# Patient Record
Sex: Female | Born: 1990 | Race: White | Hispanic: Yes | Marital: Single | Smoking: Never smoker
Health system: Southern US, Community
[De-identification: ages and names within clinical notes are randomized; demographics above are authoritative.]

## PROBLEM LIST (undated history)

## (undated) DIAGNOSIS — F988 Other specified behavioral and emotional disorders with onset usually occurring in childhood and adolescence: Secondary | ICD-10-CM

## (undated) DIAGNOSIS — I10 Essential (primary) hypertension: Secondary | ICD-10-CM

## (undated) DIAGNOSIS — K219 Gastro-esophageal reflux disease without esophagitis: Secondary | ICD-10-CM

---

## 2009-02-21 ENCOUNTER — Emergency Department (HOSPITAL_COMMUNITY): Admission: EM | Admit: 2009-02-21 | Discharge: 2009-02-21 | Payer: Self-pay | Admitting: Emergency Medicine

## 2011-02-19 ENCOUNTER — Other Ambulatory Visit: Payer: Self-pay | Admitting: Internal Medicine

## 2011-02-19 ENCOUNTER — Ambulatory Visit
Admission: RE | Admit: 2011-02-19 | Discharge: 2011-02-19 | Disposition: A | Discharge status: Home / Self Care | Payer: Medicare Other | Source: Ambulatory Visit | Attending: Internal Medicine | Admitting: Internal Medicine

## 2011-02-19 DIAGNOSIS — S8392XA Sprain of unspecified site of left knee, initial encounter: Secondary | ICD-10-CM

## 2016-03-07 ENCOUNTER — Ambulatory Visit (HOSPITAL_COMMUNITY)
Admission: EM | Admit: 2016-03-07 | Discharge: 2016-03-07 | Disposition: A | Discharge status: Home / Self Care | Payer: Medicaid Other

## 2016-03-07 ENCOUNTER — Encounter (HOSPITAL_COMMUNITY): Payer: Self-pay | Admitting: Emergency Medicine

## 2016-03-07 DIAGNOSIS — R1011 Right upper quadrant pain: Secondary | ICD-10-CM

## 2016-03-07 HISTORY — DX: Other specified behavioral and emotional disorders with onset usually occurring in childhood and adolescence: F98.8

## 2016-03-07 HISTORY — DX: Gastro-esophageal reflux disease without esophagitis: K21.9

## 2016-03-07 LAB — POCT H PYLORI SCREEN: H. PYLORI SCREEN, POC: NEGATIVE

## 2016-03-07 MED ORDER — OMEPRAZOLE 20 MG PO CPDR: 20.0000 mg | DELAYED_RELEASE_CAPSULE | Freq: Every day | ORAL | 0 refills | Status: DC

## 2016-03-07 NOTE — Discharge Instructions (Signed)
°  Su anlisis de sangre de hoy es negativo para las bacterias en su estmago. Amber RacerLa postura y descripcin de su dolor es preocupante para un problema con su vescula biliar. Esto es algo que usted necesita para Radio producerhacer un seguimiento con su mdico para que pueda obtener un ultrasonido para usted.  Mientras tanto limitar los alimentos picantes. Coma una dieta algo sosa y si su dolor empeora usted debe ir al departamento de Agricultural engineerla emergencia.  Your blood test today is negative for bacteria in your stomach. the postition and description of your pain is concerning for a problem with your gallbladder. this is something that you need to follow up with your doctor so that he can get an ultrasound for you.  meanwhile limit spicy foods. eat a rather bland diet and if your pain worsens you should go to the emergency department.

## 2016-03-07 NOTE — ED Triage Notes (Signed)
Pt has not had a solid BM since before Saturday.  She reports three episodes, of watery, light brown diarrhea on Saturday and no BM since then.  She also has epigastric pain that she describes as burning.  Pt was treated for acid reflux a year ago, but only took the medication for one month.  She denies fever at home.

## 2016-10-15 ENCOUNTER — Ambulatory Visit (HOSPITAL_COMMUNITY)
Admission: EM | Admit: 2016-10-15 | Discharge: 2016-10-15 | Disposition: A | Discharge status: Home / Self Care | Payer: Medicare Other | Attending: Family Medicine | Admitting: Family Medicine

## 2016-10-15 ENCOUNTER — Encounter (HOSPITAL_COMMUNITY): Payer: Self-pay | Admitting: Emergency Medicine

## 2016-10-15 DIAGNOSIS — S99911A Unspecified injury of right ankle, initial encounter: Secondary | ICD-10-CM

## 2016-10-15 DIAGNOSIS — W540XXA Bitten by dog, initial encounter: Secondary | ICD-10-CM

## 2016-10-15 DIAGNOSIS — S99912A Unspecified injury of left ankle, initial encounter: Secondary | ICD-10-CM | POA: Diagnosis not present

## 2016-10-15 NOTE — ED Triage Notes (Signed)
The patient presented to the [facility] with a complaint of a dog bite to both lower legs that occurred yesterday. The patient reported that the animal was current on its rabies vaccinations.

## 2016-10-15 NOTE — ED Provider Notes (Signed)
CSN: [id number]     Arrival date & time 10/15/16  1608 History   None    Chief Complaint  Patient presents with  . Animal Bite   (Consider location/radiation/quality/duration/timing/severity/associated sxs/prior Treatment) 26 year old female presents to clinic for evaluation of dog bites to her lower ankles. States she was bitten yesterday by a chihuahua  on both of her ankles. The dog has been vaccinated against rabies.   The history is provided by the patient.  Animal Bite  Contact animal:  Dog Location:  Foot Foot injury location:  L ankle and R ankle Time since incident:  1 day Pain details:    Quality:  Aching   Severity:  Mild   Timing:  Intermittent   Progression:  Unchanged Incident location:  Another residence Provoked: unprovoked   Notifications:  None Animal's rabies vaccination status:  Up to date Animal in possession: no   Tetanus status:  Unknown Relieved by:  None tried Worsened by:  Nothing Ineffective treatments:  None tried Associated symptoms: no fever, no numbness, no rash and no swelling     Past Medical History:  Diagnosis Date  . Acid reflux   . ADD (attention deficit disorder)    History reviewed. No pertinent surgical history. History reviewed. No pertinent family history. Social History  Substance Use Topics  . Smoking status: Never Smoker  . Smokeless tobacco: Never Used  . Alcohol use No   OB History    No data available     Review of Systems  Constitutional: Negative for chills, fatigue and fever.  Respiratory: Negative for cough, chest tightness, shortness of breath and wheezing.   Cardiovascular: Negative for chest pain and palpitations.  Gastrointestinal: Negative for abdominal pain, diarrhea, nausea and vomiting.  Musculoskeletal: Negative for myalgias, neck pain and neck stiffness.  Skin: Negative for rash and wound.  Neurological: Negative for dizziness and numbness.    Allergies  Patient has no known allergies.  Home  Medications   Prior to Admission medications   Not on File   Meds Ordered and Administered this Visit  Medications - No data to display  BP 137/79 (BP Location: Right Arm)   Pulse 71   Temp 98.6 F (37 C) (Oral)   Resp 18   SpO2 100%  No data found.   Physical Exam  Constitutional: She is oriented to person, place, and time. She appears well-developed and well-nourished. No distress.  HENT:  Head: Normocephalic and atraumatic.  Right Ear: External ear normal.  Left Ear: External ear normal.  Cardiovascular: Normal rate and regular rhythm.   Pulmonary/Chest: Effort normal and breath sounds normal.  Abdominal: Soft. Bowel sounds are normal.  Neurological: She is alert and oriented to person, place, and time.  Skin: Skin is warm and dry. Capillary refill takes less than 2 seconds. No rash noted. She is not diaphoretic. No erythema.  2 small bite marks one on each ankle, with bruising to the left ankle. Wounds appear to be superficial, no active bleeding, PMS intact distal to the wounds   Psychiatric: She has a normal mood and affect. Her behavior is normal.  Nursing note and vitals reviewed.   Urgent Care Course     Procedures (including critical care time)  Labs Review Labs Reviewed - No data to display  Imaging Review No results found.      MDM   1. Dog bite, initial encounter     Tetanus vaccine was offered but was declined, wounds were cleaned with Betadine,  patient encouraged to follow up with primary care, return to clinic if she develops any signs or symptoms of infection.    Dorena Bodo, NP 10/15/16 2034

## 2016-10-15 NOTE — Discharge Instructions (Signed)
Keep your wounds clean, and dry. If any time he seems redness, swelling, red streaking traveling up her legs, or draining pus from the bites, return to clinic for antibiotics. We have cleaned your wound here in clinic today. She did any time if symptoms worsen return to clinic or follow-up with your primary care provider

## 2017-11-18 ENCOUNTER — Ambulatory Visit: Payer: Medicaid Other | Admitting: Podiatry

## 2017-12-05 ENCOUNTER — Ambulatory Visit (INDEPENDENT_AMBULATORY_CARE_PROVIDER_SITE_OTHER): Payer: Medicare Other

## 2017-12-05 ENCOUNTER — Ambulatory Visit (INDEPENDENT_AMBULATORY_CARE_PROVIDER_SITE_OTHER): Payer: Medicare Other | Admitting: Podiatry

## 2017-12-05 ENCOUNTER — Encounter: Payer: Self-pay | Admitting: Podiatry

## 2017-12-05 DIAGNOSIS — M21612 Bunion of left foot: Secondary | ICD-10-CM

## 2017-12-05 DIAGNOSIS — M21619 Bunion of unspecified foot: Secondary | ICD-10-CM

## 2017-12-05 DIAGNOSIS — M21611 Bunion of right foot: Secondary | ICD-10-CM | POA: Diagnosis not present

## 2017-12-05 NOTE — Progress Notes (Signed)
Subjective:   Patient ID: Amber Salinas, female   DOB: 27 y.o.   MRN: [id number]   HPI 27 year old female presents the office today with concerns of bunions to both of her feet with the left side worse than the right.  She says the area is painful with pressure in shoes.  Denies any recent injury or trauma.  She said no recent treatment for this.  Denies any numbness or tingling.  She has no other concerns.   Review of Systems  All other systems reviewed and are negative.  Past Medical History:  Diagnosis Date  . Acid reflux   . ADD (attention deficit disorder)     History reviewed. No pertinent surgical history.  No current outpatient medications on file.  No Known Allergies        Objective:  Physical Exam  General: AAO x3, NAD  Dermatological: Skin is warm, dry and supple bilateral. Nails x 10 are well manicured; remaining integument appears unremarkable at this time. There are no open sores, no preulcerative lesions, no rash or signs of infection present.  Vascular: Dorsalis Pedis artery and Posterior Tibial artery pedal pulses are 2/4 bilateral with immedate capillary fill time.There is no pain with calf compression, swelling, warmth, erythema.   Neruologic: Grossly intact via light touch bilateral.  Protective threshold with Semmes Wienstein monofilament intact to all pedal sites bilateral.   Musculoskeletal: Clinically doing moderately to be in the left foot there is minimal HAV on the right foot.  There is tenderness palpation of the bunion sites.  There is no crepitation with first MPJ range of motion however there is hypermobility present of the first ray.  There is no other areas of tenderness identified at this time.  Decrease in medial arch upon weightbearing.  Muscular strength 5/5 in all groups tested bilateral.  Gait: Unassisted, Nonantalgic.       Assessment:   Bilateral HAV    Plan:  -Treatment options discussed including all alternatives,  risks, and complications -Etiology of symptoms were discussed -X-rays were obtained and reviewed with the patient.  Severe HAV is present.  There is no evidence of acute fracture or stress fracture. -We discussed both conservative as well as surgical treatment options.  This time she elects to proceed with continue with conservative trach care.  We discussed shoe uppercase, orthotics, padding.  Also discussed steroid injection if needed.  In the future if she desires surgical intervention we discussed a Lapidus bunionectomy.  We discussed the surgery as well as the postoperative course. -Follow-up as needed.  Vivi Barrack DPM

## 2017-12-05 NOTE — Patient Instructions (Signed)

## 2017-12-05 NOTE — Progress Notes (Signed)
Dg foot  

## 2019-08-09 ENCOUNTER — Other Ambulatory Visit: Payer: Self-pay

## 2019-08-09 ENCOUNTER — Encounter (HOSPITAL_COMMUNITY): Payer: Self-pay | Admitting: Emergency Medicine

## 2019-08-09 ENCOUNTER — Emergency Department (HOSPITAL_COMMUNITY)
Admission: EM | Admit: 2019-08-09 | Discharge: 2019-08-09 | Disposition: A | Discharge status: Home / Self Care | Payer: Medicare Other | Attending: Emergency Medicine | Admitting: Emergency Medicine

## 2019-08-09 DIAGNOSIS — R030 Elevated blood-pressure reading, without diagnosis of hypertension: Secondary | ICD-10-CM | POA: Insufficient documentation

## 2019-08-09 DIAGNOSIS — U071 COVID-19: Secondary | ICD-10-CM | POA: Diagnosis not present

## 2019-08-09 MED ORDER — BENZONATATE 100 MG PO CAPS: 100.0000 mg | ORAL_CAPSULE | Freq: Three times a day (TID) | ORAL | 0 refills | Status: DC

## 2019-08-09 NOTE — Discharge Instructions (Signed)
As discussed, your blood pressure was mildly elevated today in the ER. I recommend you call your PCP tomorrow to schedule an appointment for further evaluation. I am also sending you home with cough medication for your cough. Continue to self quarantine away from others for 10 days since your symptoms onset. You must be fever free for 3 days before you quarantine can end. Return to the ER for new or worsening symptoms.

## 2019-08-09 NOTE — ED Triage Notes (Signed)
C/o BP 144/90 at home.  Denies any complaints at this time.  Pt tested COVID + yesterday and all family members at home are +.

## 2019-08-09 NOTE — ED Provider Notes (Signed)
[facility] EMERGENCY DEPARTMENT Provider Note   CSN: [id number] Arrival date & time: 08/09/19  7510     History Chief Complaint  Patient presents with   COVID +   Hypertension    Amber Salinas is a 29 y.o. female with a past medical history significant for GERD and ADHD who presents the ED due to concerns of elevated blood pressure at home.  Patient states she took her blood pressure at home which was 144/90.  Patient denies history of hypertension.  Patient denies chest pain, headache, changes to vision, dysuria, and decreased urinary output. Patient also states she tested positive for Covid yesterday.  She went to urgent care yesterday given her family members previously tested positive.  She admits to an intermittent dry cough but denies fever, chills, abdominal pain, nausea, vomiting, sore throat, and shortness of breath. Patient has not tried anything for her symptoms prior to arrival. Patient denies numbness/tingling and unilateral weakness.       Past Medical History:  Diagnosis Date   Acid reflux    ADD (attention deficit disorder)     Patient Active Problem List   Diagnosis Date Noted   Bunion 12/05/2017    History reviewed. No pertinent surgical history.   OB History   No obstetric history on file.     No family history on file.  Social History   Tobacco Use   Smoking status: Never Smoker   Smokeless tobacco: Never Used  Substance Use Topics   Alcohol use: No   Drug use: No    Home Medications Prior to Admission medications   Medication Sig Start Date End Date Taking? Authorizing Provider  benzonatate (TESSALON) 100 MG capsule Take 1 capsule (100 mg total) by mouth every 8 (eight) hours. 08/09/19   Mannie Stabile, PA-C    Allergies    Patient has no known allergies.  Review of Systems   Review of Systems  Constitutional: Negative for chills and fever.  HENT: Negative for congestion, rhinorrhea and sore  throat.   Eyes: Negative for visual disturbance.  Respiratory: Positive for cough (intermittent dry cough). Negative for shortness of breath.   Cardiovascular: Negative for chest pain, palpitations and leg swelling.  Gastrointestinal: Negative for abdominal pain, diarrhea, nausea and vomiting.  Genitourinary: Positive for frequency (diagnosed with overactive bladder by PCP). Negative for dysuria.  Neurological: Negative for weakness, numbness and headaches.    Physical Exam Updated Vital Signs BP (!) 141/92 (BP Location: Right Arm)    Pulse 94    Temp 98.5 F (36.9 C) (Oral)    Resp 16    Ht 5\' 4"  (1.626 m)    LMP 07/26/2019    SpO2 100%   Physical Exam Vitals and nursing note reviewed.  Constitutional:      General: She is not in acute distress.    Appearance: She is not ill-appearing.  HENT:     Head: Normocephalic.  Eyes:     Extraocular Movements: Extraocular movements intact.     Pupils: Pupils are equal, round, and reactive to light.  Cardiovascular:     Rate and Rhythm: Normal rate and regular rhythm.     Pulses: Normal pulses.     Heart sounds: Normal heart sounds. No murmur. No friction rub. No gallop.   Pulmonary:     Effort: Pulmonary effort is normal.     Breath sounds: Normal breath sounds.     Comments: Respirations equal and unlabored, patient able to speak in  full sentences, lungs clear to auscultation bilaterally. Abdominal:     General: Abdomen is flat. There is no distension.     Palpations: Abdomen is soft.     Tenderness: There is no abdominal tenderness. There is no guarding or rebound.  Musculoskeletal:     Cervical back: Neck supple.     Right lower leg: No edema.     Left lower leg: No edema.     Comments: Able to move all 4 extremities without difficulty. No lower extremity edema.   Skin:    General: Skin is warm and dry.  Neurological:     General: No focal deficit present.     Mental Status: She is alert.     Comments: Speech is clear, able to  follow commands CN III-XII intact Normal strength in upper and lower extremities bilaterally including dorsiflexion and plantar flexion, strong and equal grip strength Sensation grossly intact throughout Moves extremities without ataxia, coordination intact No pronator drift Ambulates without difficulty     ED Results / Procedures / Treatments   Labs (all labs ordered are listed, but only abnormal results are displayed) Labs Reviewed - No data to display  EKG None  Radiology No results found.  Procedures Procedures (including critical care time)  Medications Ordered in ED Medications - No data to display  ED Course  I have reviewed the triage vital signs and the nursing notes.  Pertinent labs & imaging results that were available during my care of the patient were reviewed by me and considered in my medical decision making (see chart for details).    MDM Rules/Calculators/A&P                     28 year old female presents to the ED due to concerns of elevated blood pressure.  She took her blood pressure at home which was 144/90.  Patient denies history of hypertension.  She also tested positive for Covid yesterday at urgent care.  Patient admits to an intermittent dry cough, but denies shortness of breath, fever, chills, lower extremity edema, abdominal pain, nausea, vomiting, diarrhea.  Blood pressure in the ED upon arrival was 155/99.  Upon recheck blood pressure decreased to 141/92.  Patient without signs of hypertensive urgency. No concern for hypertensive emergency. Do not feel any labs or EKG are warranted at this time.  Discussed case with Dr. Particia Nearing who agrees with assessment and plan. Advised patient to call PCP tomorrow to further evaluate high blood pressure. Will discharge patient with cough medication. Patient ambulated in the ED and maintained O2 saturation between 98-100% without difficulty. Lungs clear to auscultation bilaterally. No chest x-ray warranted at this  time. Self quarantine guidelines discussed with patient. Strict ED precautions discussed with patient. Patient states understanding and agrees to plan. Patient discharged home in no acute distress and stable vitals.  Amber Salinas was evaluated in Emergency Department on 08/09/2019 for the symptoms described in the history of present illness. She was evaluated in the context of the global COVID-19 pandemic, which necessitated consideration that the patient might be at risk for infection with the SARS-CoV-2 virus that causes COVID-19. Institutional protocols and algorithms that pertain to the evaluation of patients at risk for COVID-19 are in a state of rapid change based on information released by regulatory bodies including the CDC and federal and state organizations. These policies and algorithms were followed during the patient's care in the ED.  Final Clinical Impression(s) / ED Diagnoses Final diagnoses:  Elevated  blood pressure reading  COVID-19 virus infection    Rx / DC Orders ED Discharge Orders         Ordered    benzonatate (TESSALON) 100 MG capsule  Every 8 hours     08/09/19 1044           Karie Kirks 08/09/19 1048    Isla Pence, MD 08/09/19 1059

## 2019-08-09 NOTE — ED Notes (Addendum)
Pt ambulated in exam room on pulse ox. O2 saturation stayed between 98-100%. This tech asked pt if she was experiencing any ShOB. Pt stated, "only a little bit." Pt did not appear to be in any distress upon ambulating. PA, [place], notified.

## 2019-08-23 ENCOUNTER — Emergency Department (HOSPITAL_COMMUNITY)
Admission: EM | Admit: 2019-08-23 | Discharge: 2019-08-23 | Disposition: A | Discharge status: Home / Self Care | Payer: Medicare Other | Attending: Emergency Medicine | Admitting: Emergency Medicine

## 2019-08-23 ENCOUNTER — Other Ambulatory Visit: Payer: Self-pay

## 2019-08-23 ENCOUNTER — Emergency Department (HOSPITAL_COMMUNITY): Payer: Medicare Other

## 2019-08-23 DIAGNOSIS — Z79899 Other long term (current) drug therapy: Secondary | ICD-10-CM | POA: Insufficient documentation

## 2019-08-23 DIAGNOSIS — R1031 Right lower quadrant pain: Secondary | ICD-10-CM | POA: Insufficient documentation

## 2019-08-23 LAB — I-STAT BETA HCG BLOOD, ED (MC, WL, AP ONLY): I-stat hCG, quantitative: <5 m[IU]/mL (ref <5)

## 2019-08-23 LAB — WET PREP, GENITAL
Clue Cells Wet Prep HPF POC: NONE SEEN
Sperm: NONE SEEN
Trich, Wet Prep: NONE SEEN
Yeast Wet Prep HPF POC: NONE SEEN

## 2019-08-23 LAB — CBC WITH DIFFERENTIAL/PLATELET
Abs Immature Granulocytes: 0.01 10*3/uL (ref 0.00–0.07)
Basophils Absolute: 0 10*3/uL (ref 0.0–0.1)
Basophils Relative: 1 %
Eosinophils Absolute: 0.1 10*3/uL (ref 0.0–0.5)
Eosinophils Relative: 1 %
HCT: 39.2 % (ref 36.0–46.0)
Hemoglobin: 12.8 g/dL (ref 12.0–15.0)
Immature Granulocytes: 0 %
Lymphocytes Relative: 18 %
Lymphs Abs: 1.5 10*3/uL (ref 0.7–4.0)
MCH: 28.1 pg (ref 26.0–34.0)
MCHC: 32.7 g/dL (ref 30.0–36.0)
MCV: 86 fL (ref 80.0–100.0)
Monocytes Absolute: 0.4 10*3/uL (ref 0.1–1.0)
Monocytes Relative: 5 %
Neutro Abs: 6 10*3/uL (ref 1.7–7.7)
Neutrophils Relative %: 75 %
Platelets: 230 10*3/uL (ref 150–400)
RBC: 4.56 MIL/uL (ref 3.87–5.11)
RDW: 13.2 % (ref 11.5–15.5)
WBC: 8 10*3/uL (ref 4.0–10.5)
nRBC: 0 % (ref 0.0–0.2)

## 2019-08-23 LAB — LIPASE, BLOOD: Lipase: 31 U/L (ref 11–51)

## 2019-08-23 LAB — COMPREHENSIVE METABOLIC PANEL
ALT: 40 U/L (ref 0–44)
AST: 26 U/L (ref 15–41)
Albumin: 4.4 g/dL (ref 3.5–5.0)
Alkaline Phosphatase: 47 U/L (ref 38–126)
Anion gap: 11 (ref 5–15)
BUN: 12 mg/dL (ref 6–20)
CO2: 23 mmol/L (ref 22–32)
Calcium: 9.6 mg/dL (ref 8.9–10.3)
Chloride: 104 mmol/L (ref 98–111)
Creatinine, Ser: 0.64 mg/dL (ref 0.44–1.00)
GFR calc Af Amer: >60 mL/min (ref >60)
GFR calc non Af Amer: >60 mL/min (ref >60)
Glucose, Bld: 116 mg/dL — ABNORMAL HIGH (ref 70–99)
Potassium: 3.2 mmol/L — ABNORMAL LOW (ref 3.5–5.1)
Sodium: 138 mmol/L (ref 135–145)
Total Bilirubin: 0.3 mg/dL (ref 0.3–1.2)
Total Protein: 7.5 g/dL (ref 6.5–8.1)

## 2019-08-23 LAB — URINALYSIS, ROUTINE W REFLEX MICROSCOPIC
Bilirubin Urine: NEGATIVE
Glucose, UA: NEGATIVE mg/dL
Ketones, ur: NEGATIVE mg/dL
Nitrite: NEGATIVE
Protein, ur: NEGATIVE mg/dL
Specific Gravity, Urine: 1.016 (ref 1.005–1.030)
pH: 6 (ref 5.0–8.0)

## 2019-08-23 MED ORDER — ONDANSETRON HCL 4 MG/2ML IJ SOLN: 4.0000 mg | INTRAMUSCULAR | Status: DC | PRN

## 2019-08-23 MED ORDER — HYDROMORPHONE HCL 1 MG/ML IJ SOLN
0.5000 mg | Freq: Once | INTRAMUSCULAR | Status: AC
  Administered 2019-08-23: 0.5 mg via INTRAVENOUS
  Filled 2019-08-23: qty 1

## 2019-08-23 MED ORDER — SODIUM CHLORIDE 0.9% FLUSH: 3.0000 mL | Freq: Once | INTRAVENOUS | Status: DC

## 2019-08-23 NOTE — Discharge Instructions (Addendum)
Your tests here, including your blood tests, x-rays and pelvic exam do not identify a source of your pain. The test do insure that your appendix is normal, and there is not likely any infection present.   Please follow up with Dr. Mikeal Hawthorne for further evaluation in the outpatient setting to continue your evaluation. Take Tylenol for pain.  If you develop severe pain, have any uncontrolled vomiting or a high fever, please return to the emergency department.

## 2019-08-23 NOTE — ED Triage Notes (Signed)
Per pt she started having severe right flank and abdominal pain tonight. Pt has severe cramping and burning with urination. Pt said she was dx with covid on January 22

## 2019-08-23 NOTE — ED Provider Notes (Signed)
[facility] EMERGENCY DEPARTMENT Provider Note   CSN: [id number] Arrival date & time: 08/23/19  0242     History Chief Complaint  Patient presents with  . Flank Pain  . Abdominal Pain    Amber Salinas is a 29 y.o. female.  Patient to ED with sudden onset of RLQ abdominal pain that started tonight. No nausea, vomiting, diarrhea. No fever. She reports that she went to the bathroom to urinate, and then felt she needed to go almost immediately after, but reports she could not due to the abdominal pain. No history of kidney stones. No pain in the back or right flank. She reports that she takes oxybutynin for "my urine" but does not know her diagnosis that makes this necessary. No vaginal discharge.    The history is provided by the patient.  Flank Pain Associated symptoms include abdominal pain. Pertinent negatives include no chest pain and no shortness of breath.  Abdominal Pain Associated symptoms: dysuria   Associated symptoms: no chest pain, no cough, no diarrhea, no fever, no hematuria, no nausea, no shortness of breath, no vaginal bleeding, no vaginal discharge and no vomiting        Past Medical History:  Diagnosis Date  . Acid reflux   . ADD (attention deficit disorder)     Patient Active Problem List   Diagnosis Date Noted  . Bunion 12/05/2017    No past surgical history on file.   OB History   No obstetric history on file.     No family history on file.  Social History   Tobacco Use  . Smoking status: Never Smoker  . Smokeless tobacco: Never Used  Substance Use Topics  . Alcohol use: No  . Drug use: No    Home Medications Prior to Admission medications   Medication Sig Start Date End Date Taking? Authorizing Provider  benzonatate (TESSALON) 100 MG capsule Take 1 capsule (100 mg total) by mouth every 8 (eight) hours. 08/09/19   Suzy Bouchard, PA-C    Allergies    Patient has no known allergies.  Review of Systems    Review of Systems  Constitutional: Negative for fever.  HENT: Negative.   Respiratory: Negative.  Negative for cough and shortness of breath.   Cardiovascular: Negative for chest pain.  Gastrointestinal: Positive for abdominal pain. Negative for diarrhea, nausea and vomiting.  Genitourinary: Positive for difficulty urinating, dysuria and flank pain. Negative for hematuria, vaginal bleeding and vaginal discharge.  Musculoskeletal: Negative for back pain.  Skin: Negative for rash.    Physical Exam Updated Vital Signs BP (!) 162/110 (BP Location: Right Arm)   Pulse (!) 106   Temp 98.6 F (37 C) (Oral)   Resp 18   LMP 07/26/2019   SpO2 99%   Physical Exam Vitals and nursing note reviewed.  Constitutional:      Appearance: She is well-developed. She is obese.  HENT:     Head: Normocephalic.  Cardiovascular:     Rate and Rhythm: Normal rate and regular rhythm.  Pulmonary:     Effort: Pulmonary effort is normal.     Breath sounds: Normal breath sounds.  Abdominal:     General: Bowel sounds are normal.     Palpations: Abdomen is soft.     Tenderness: There is abdominal tenderness in the suprapubic area. There is guarding. There is no rebound.  Musculoskeletal:        General: Normal range of motion.     Cervical back:  Normal range of motion and neck supple.  Skin:    General: Skin is warm and dry.     Findings: No rash.  Neurological:     Mental Status: She is alert.     Cranial Nerves: No cranial nerve deficit.     ED Results / Procedures / Treatments   Labs (all labs ordered are listed, but only abnormal results are displayed) Labs Reviewed  COMPREHENSIVE METABOLIC PANEL - Abnormal; Notable for the following components:      Result Value   Potassium 3.2 (*)    Glucose, Bld 116 (*)    All other components within normal limits  URINALYSIS, ROUTINE W REFLEX MICROSCOPIC - Abnormal; Notable for the following components:   Color, Urine STRAW (*)    Hgb urine dipstick  MODERATE (*)    Leukocytes,Ua TRACE (*)    Bacteria, UA RARE (*)    All other components within normal limits  LIPASE, BLOOD  CBC WITH DIFFERENTIAL/PLATELET  I-STAT BETA HCG BLOOD, ED ([facility], [facility], [facility] ONLY)    EKG None  Radiology No results found.  Procedures Procedures (including critical care time)  Medications Ordered in ED Medications  sodium chloride flush (NS) 0.9 % injection 3 mL (has no administration in time range)  HYDROmorphone (DILAUDID) injection 0.5 mg (has no administration in time range)  ondansetron (ZOFRAN) injection 4 mg (has no administration in time range)    ED Course  I have reviewed the triage vital signs and the nursing notes.  Pertinent labs & imaging results that were available during my care of the patient were reviewed by me and considered in my medical decision making (see chart for details).    MDM Rules/Calculators/A&P                      Patient to ED with sudden onset RLQ abdominal pain as detailed in HPI.   No fever, vomiting, diarrhea. CT scan ordered and is negative for appy. Labs are essentially unremarkable. Pelvic exam limited as she has never been sexually active nor had a pelvic exam. Wet prep done and show WBC's only.   Discussed test results with the patient. VSS. Pain is improved. She is felt appropriate for discharge home and is strongly encouraged to follow up with Dr. Mikeal Hawthorne.  Final Clinical Impression(s) / ED Diagnoses Final diagnoses:  None   1. RLQ abdominal pain  Rx / DC Orders ED Discharge Orders    None       Danne Harbor 08/24/19 0902    Mesner, Barbara Cower, MD 08/25/19 [phone]

## 2020-11-02 IMAGING — CT CT RENAL STONE PROTOCOL
2 of 4 series · 16 of 46 positions shown, 18 images · non-contrast
Comparison: None.

CLINICAL DATA: Right lower quadrant abdominal pain

EXAM:
CT ABDOMEN AND PELVIS WITHOUT CONTRAST
TECHNIQUE: Multidetector CT imaging of the abdomen and pelvis was performed
following the standard protocol without IV contrast.

[Series 3: renal stone 5.0 · axial · 0.75mm/px · z∈[-432,-32]mm · 13 of 90 slices shown, 15 images]
[im 5/90  soft-tissue]
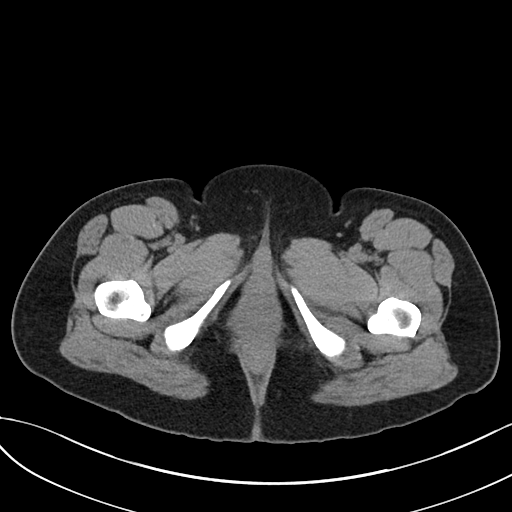
[im 5/90  bone]
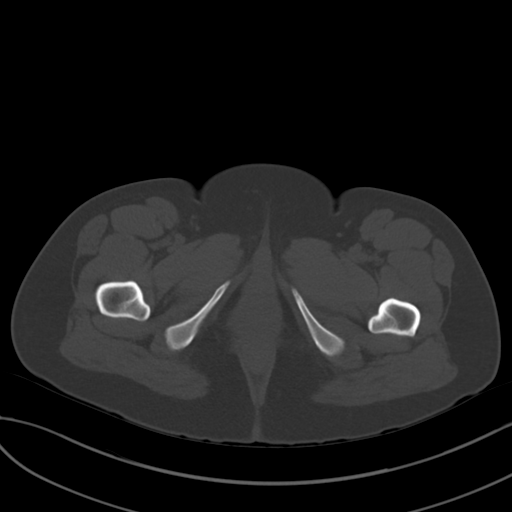
[im 13/90  soft-tissue]
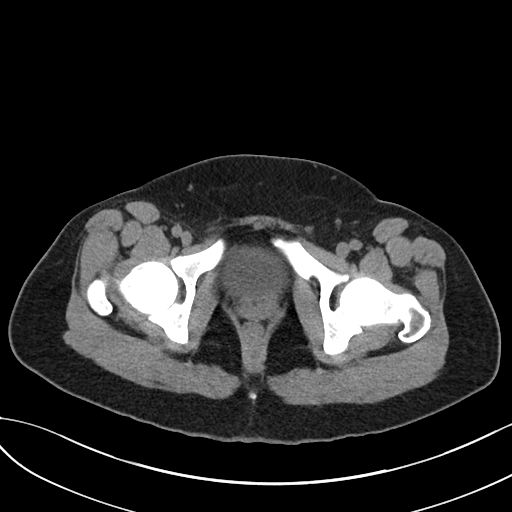
[im 21/90  soft-tissue]
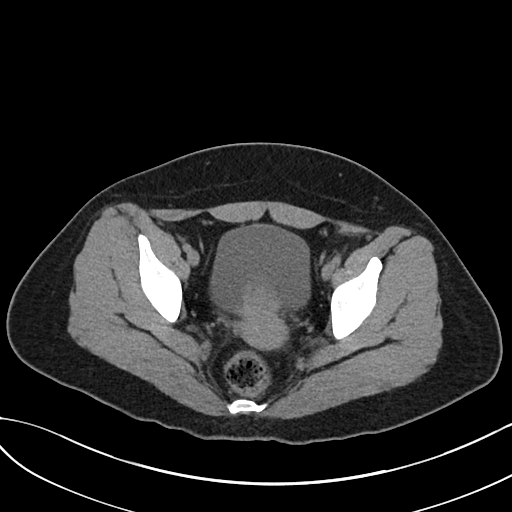
[im 25/90  soft-tissue]
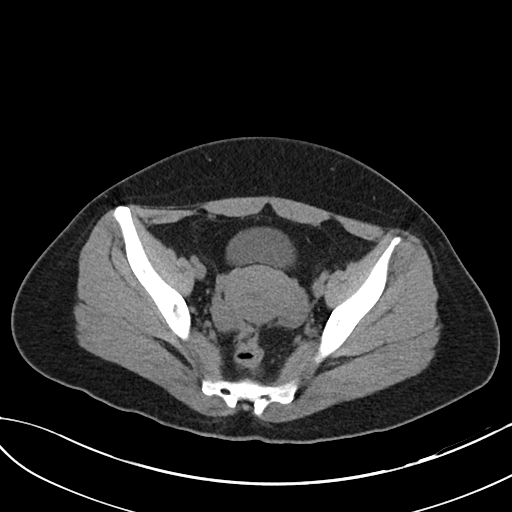
[im 33/90  soft-tissue]
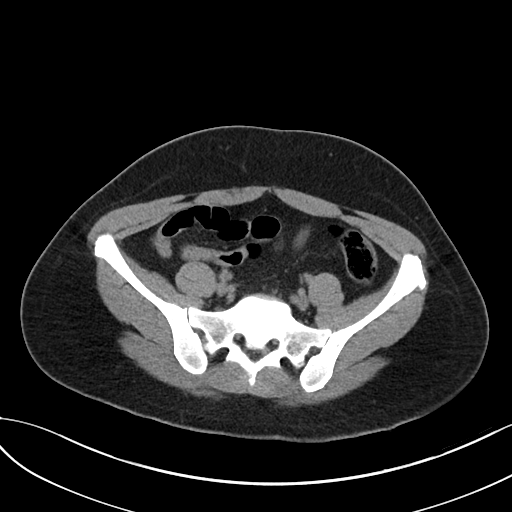
[im 37/90  soft-tissue]
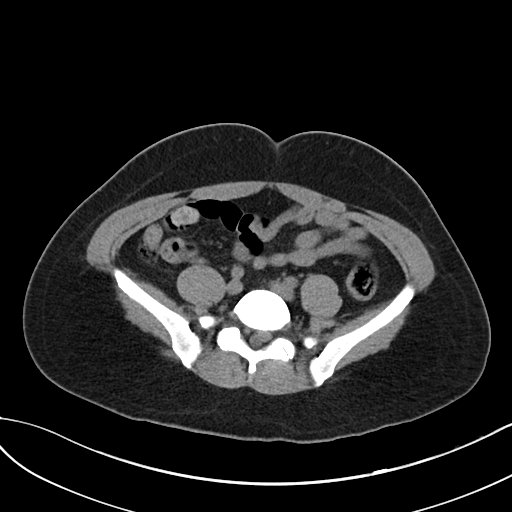
[im 45/90  soft-tissue]
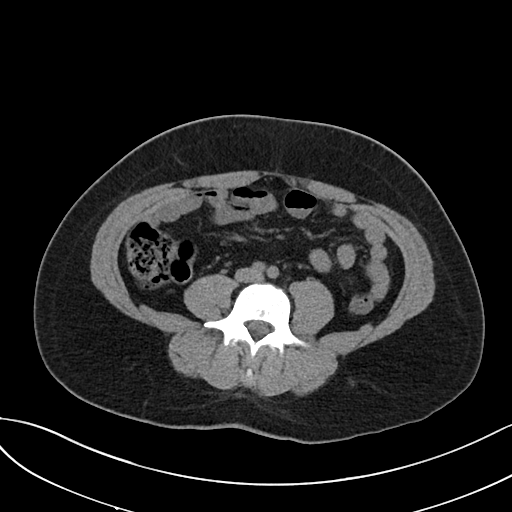
[im 53/90  soft-tissue]
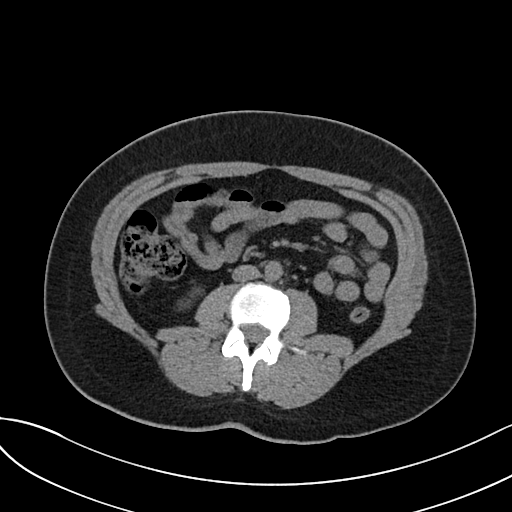
[im 57/90  soft-tissue]
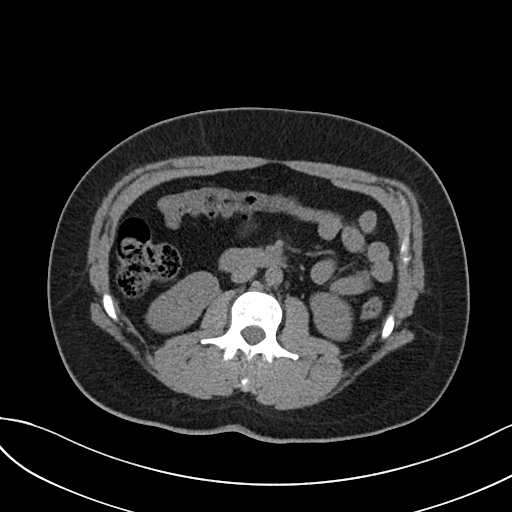
[im 57/90  bone]
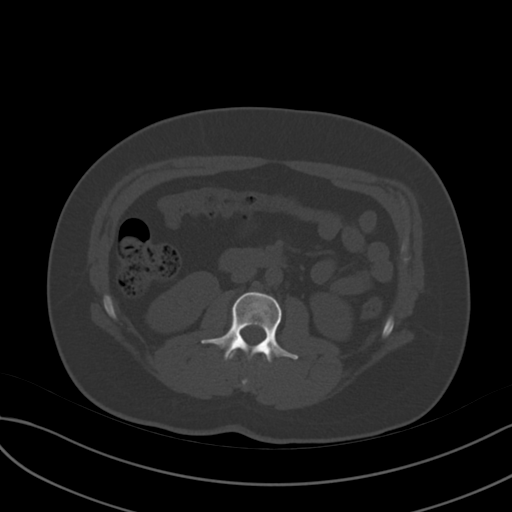
[im 65/90  soft-tissue]
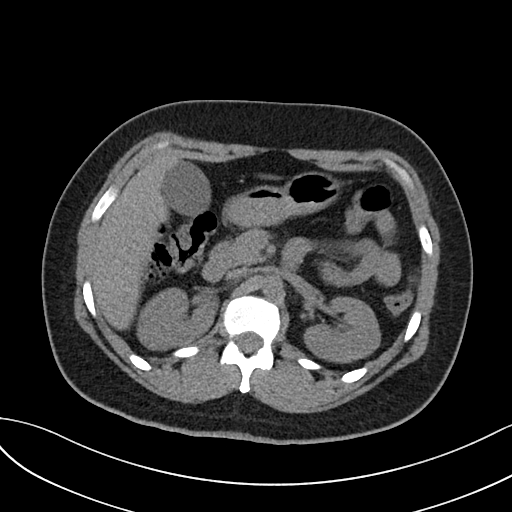
[im 69/90  soft-tissue]
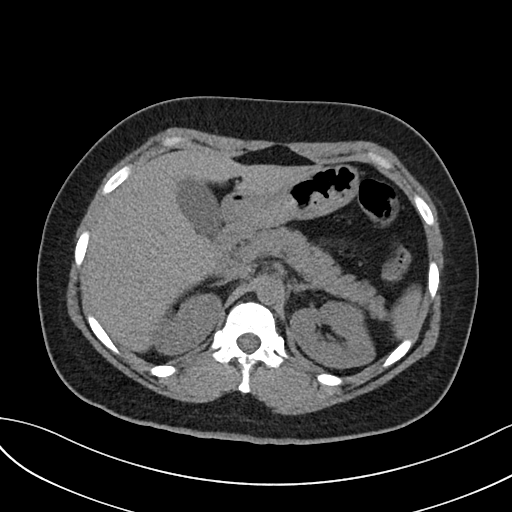
[im 77/90  soft-tissue]
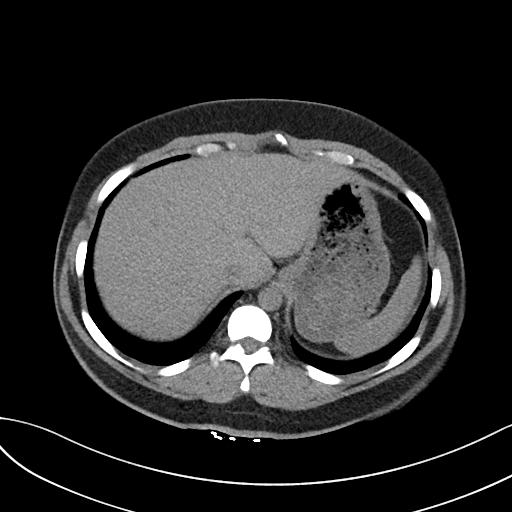
[im 85/90  soft-tissue]
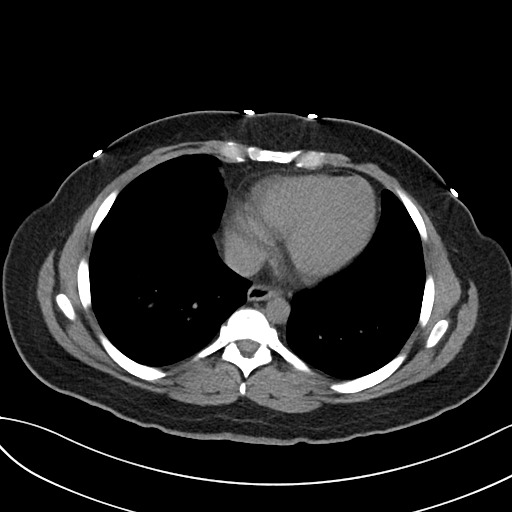

[Series 5: renal stone 3.0 cor · coronal · 0.62mm/px · 3 of 89 slices shown]
[im 30/89  soft-tissue]
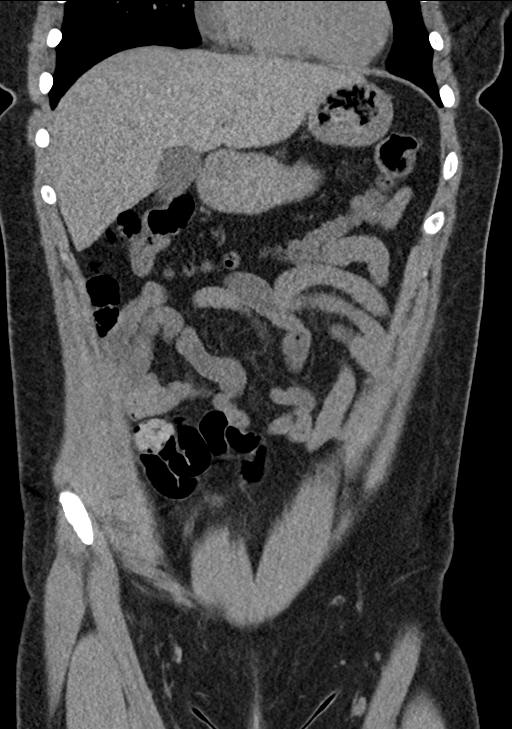
[im 40/89  soft-tissue]
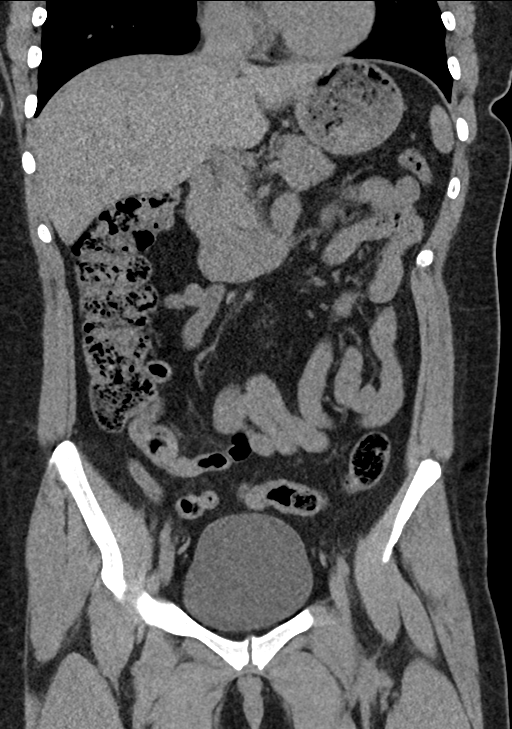
[im 49/89  soft-tissue]
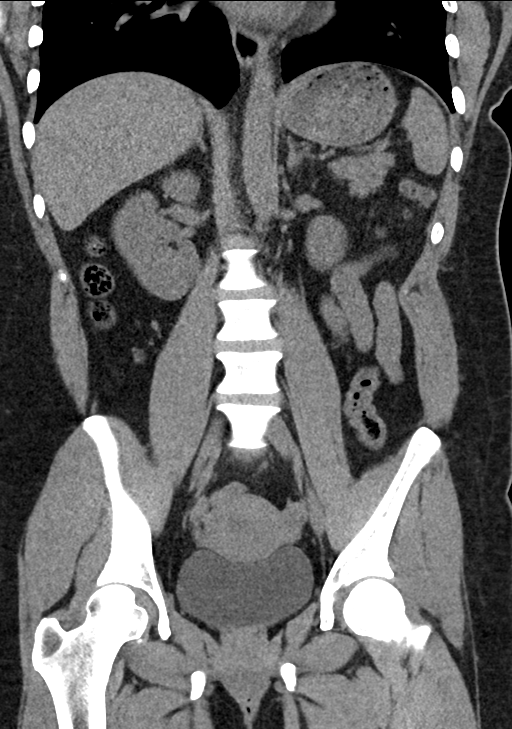

[16 of 46 positions shown; findings below may reference images not displayed]

FINDINGS: Lower chest: Normal

Hepatobiliary: Normal

Pancreas: Normal

Spleen: Normal

Adrenals/Urinary Tract: Adrenal glands are normal. Kidneys are
normal. No swelling. No hydronephrosis. No mass, cyst or stone. No
stone in the bladder.

Stomach/Bowel: No acute bowel finding.  The appendix looks normal.

Vascular/Lymphatic: Negative

Reproductive: Normal. No evidence of pelvic mass or inflammatory
disease.

Other: No free fluid or air.

Musculoskeletal: Normal
IMPRESSION: Normal examination. No abnormality seen to explain the presenting
symptoms. No evidence of urinary tract stone disease, other urinary
tract pathology, appendicitis or GYN pathology.

## 2022-01-20 ENCOUNTER — Encounter: Payer: Self-pay | Admitting: Critical Care Medicine

## 2022-01-20 ENCOUNTER — Encounter: Payer: Self-pay | Admitting: *Deleted

## 2022-01-20 LAB — GLUCOSE, POCT (MANUAL RESULT ENTRY): POC Glucose: 97 mg/dl (ref 70–99)

## 2022-01-20 NOTE — Progress Notes (Signed)
See other screening note

## 2022-10-03 ENCOUNTER — Ambulatory Visit: Payer: Medicare Other | Admitting: Podiatry

## 2022-10-04 ENCOUNTER — Ambulatory Visit: Payer: Medicare Other | Admitting: Podiatry

## 2022-10-15 ENCOUNTER — Ambulatory Visit (INDEPENDENT_AMBULATORY_CARE_PROVIDER_SITE_OTHER): Payer: Medicare Other | Admitting: Podiatry

## 2022-10-15 ENCOUNTER — Encounter: Payer: Self-pay | Admitting: Podiatry

## 2022-10-15 ENCOUNTER — Ambulatory Visit (INDEPENDENT_AMBULATORY_CARE_PROVIDER_SITE_OTHER): Payer: Medicare Other

## 2022-10-15 DIAGNOSIS — M21619 Bunion of unspecified foot: Secondary | ICD-10-CM

## 2022-10-15 DIAGNOSIS — M778 Other enthesopathies, not elsewhere classified: Secondary | ICD-10-CM | POA: Diagnosis not present

## 2022-10-15 DIAGNOSIS — M7672 Peroneal tendinitis, left leg: Secondary | ICD-10-CM | POA: Diagnosis not present

## 2022-10-15 MED ORDER — TRIAMCINOLONE ACETONIDE 10 MG/ML IJ SUSP
10.0000 mg | Freq: Once | INTRAMUSCULAR | Status: AC
Start: 2022-10-15 — End: 2022-10-15
  Administered 2022-10-15: 10 mg

## 2022-10-17 NOTE — Progress Notes (Signed)
Subjective:   Patient ID: Amber Salinas, female   DOB: 32 y.o.   MRN: [id number]   HPI Patient states that she has developed a lot of pain in the outside of her left foot in the last week or 2 and that she has severe bunion deformities of both feet that she is known she is needed to get corrected and has more time now to be able to do this.  Patient did see Dr. Earleen Newport approximately 5 years ago.  Patient does not smoke tries to be active   Review of Systems  All other systems reviewed and are negative.       Objective:  Physical Exam Vitals and nursing note reviewed.  Constitutional:      Appearance: She is well-developed.  Pulmonary:     Effort: Pulmonary effort is normal.  Musculoskeletal:        General: Normal range of motion.  Skin:    General: Skin is warm.  Neurological:     Mental Status: She is alert.     Neurovascular status intact muscle strength adequate range of motion adequate with patient noted to have exquisite discomfort in the lateral side of the peroneal insertion fifth metatarsal with fluid buildup no indication of tendon dysfunction and is found to have significant flatfoot deformity with large bunion deformity bilateral with redness and pain around the site.  Patient has good digital perfusion well-oriented x 3     Assessment:  Acute peroneal tendinitis left along with chronic bunion deformity bilateral painful when pressed     Plan:  H&P x-ray reviewed and discussed and at this point discussed injection explaining chances for rupture with injection she wants to have this done I did sterile prep injected the sheath left peroneal 3 mg Dexasone Kenalog 5 mg Xylocaine at insertion and instructed on reduced activity.  Discussed bunions this will require a probable Lapidus fusion and I am referring back to Dr. Earleen Newport who saw this patient in the past.  Patient had all questions answered and will be seen back  X-rays indicate significant elevation of the  intermetatarsal angle approximately 18 degrees with a flexible foot structure

## 2022-11-12 ENCOUNTER — Ambulatory Visit (INDEPENDENT_AMBULATORY_CARE_PROVIDER_SITE_OTHER): Payer: Medicare Other | Admitting: Podiatry

## 2022-11-12 DIAGNOSIS — M21619 Bunion of unspecified foot: Secondary | ICD-10-CM

## 2022-11-12 DIAGNOSIS — M778 Other enthesopathies, not elsewhere classified: Secondary | ICD-10-CM

## 2022-11-12 DIAGNOSIS — M7672 Peroneal tendinitis, left leg: Secondary | ICD-10-CM | POA: Diagnosis not present

## 2022-11-12 MED ORDER — MELOXICAM 15 MG PO TABS: 15.0000 mg | ORAL_TABLET | Freq: Every day | ORAL | 0 refills | Status: DC | PRN

## 2022-11-12 NOTE — Patient Instructions (Signed)
For inserts I like POWERSTEPS, SUPERFEET, [employer]  Plantar Fasciitis (Heel Spur Syndrome) with Rehab The plantar fascia is a fibrous, ligament-like, soft-tissue structure that spans the bottom of the foot. Plantar fasciitis is a condition that causes pain in the foot due to inflammation of the tissue. SYMPTOMS  Pain and tenderness on the underneath side of the foot. Pain that worsens with standing or walking. CAUSES  Plantar fasciitis is caused by irritation and injury to the plantar fascia on the underneath side of the foot. Common mechanisms of injury include: Direct trauma to bottom of the foot. Damage to a small nerve that runs under the foot where the main fascia attaches to the heel bone. Stress placed on the plantar fascia due to bone spurs. RISK INCREASES WITH:  Activities that place stress on the plantar fascia (running, jumping, pivoting, or cutting). Poor strength and flexibility. Improperly fitted shoes. Tight calf muscles. Flat feet. Failure to warm-up properly before activity. Obesity. PREVENTION Warm up and stretch properly before activity. Allow for adequate recovery between workouts. Maintain physical fitness: Strength, flexibility, and endurance. Cardiovascular fitness. Maintain a health body weight. Avoid stress on the plantar fascia. Wear properly fitted shoes, including arch supports for individuals who have flat feet.  PROGNOSIS  If treated properly, then the symptoms of plantar fasciitis usually resolve without surgery. However, occasionally surgery is necessary.  RELATED COMPLICATIONS  Recurrent symptoms that may result in a chronic condition. Problems of the lower back that are caused by compensating for the injury, such as limping. Pain or weakness of the foot during push-off following surgery. Chronic inflammation, scarring, and partial or complete fascia tear, occurring more often from repeated injections.  TREATMENT  Treatment initially involves  the use of ice and medication to help reduce pain and inflammation. The use of strengthening and stretching exercises may help reduce pain with activity, especially stretches of the Achilles tendon. These exercises may be performed at home or with a therapist. Your caregiver may recommend that you use heel cups of arch supports to help reduce stress on the plantar fascia. Occasionally, corticosteroid injections are given to reduce inflammation. If symptoms persist for greater than 6 months despite non-surgical (conservative), then surgery may be recommended.   MEDICATION  If pain medication is necessary, then nonsteroidal anti-inflammatory medications, such as aspirin and ibuprofen, or other minor pain relievers, such as acetaminophen, are often recommended. Do not take pain medication within 7 days before surgery. Prescription pain relievers may be given if deemed necessary by your caregiver. Use only as directed and only as much as you need. Corticosteroid injections may be given by your caregiver. These injections should be reserved for the most serious cases, because they may only be given a certain number of times.  HEAT AND COLD Cold treatment (icing) relieves pain and reduces inflammation. Cold treatment should be applied for 10 to 15 minutes every 2 to 3 hours for inflammation and pain and immediately after any activity that aggravates your symptoms. Use ice packs or massage the area with a piece of ice (ice massage). Heat treatment may be used prior to performing the stretching and strengthening activities prescribed by your caregiver, physical therapist, or athletic trainer. Use a heat pack or soak the injury in warm water.  SEEK IMMEDIATE MEDICAL CARE IF: Treatment seems to offer no benefit, or the condition worsens. Any medications produce adverse side effects.  EXERCISES- RANGE OF MOTION (ROM) AND STRETCHING EXERCISES - Plantar Fasciitis (Heel Spur Syndrome) These exercises may help    you when beginning to rehabilitate your injury. Your symptoms may resolve with or without further involvement from your physician, physical therapist or athletic trainer. While completing these exercises, remember:  Restoring tissue flexibility helps normal motion to return to the joints. This allows healthier, less painful movement and activity. An effective stretch should be held for at least 30 seconds. A stretch should never be painful. You should only feel a gentle lengthening or release in the stretched tissue.  RANGE OF MOTION - Toe Extension, Flexion Sit with your right / left leg crossed over your opposite knee. Grasp your toes and gently pull them back toward the top of your foot. You should feel a stretch on the bottom of your toes and/or foot. Hold this stretch for 10 seconds. Now, gently pull your toes toward the bottom of your foot. You should feel a stretch on the top of your toes and or foot. Hold this stretch for 10 seconds. Repeat  times. Complete this stretch 3 times per day.   RANGE OF MOTION - Ankle Dorsiflexion, Active Assisted Remove shoes and sit on a chair that is preferably not on a carpeted surface. Place right / left foot under knee. Extend your opposite leg for support. Keeping your heel down, slide your right / left foot back toward the chair until you feel a stretch at your ankle or calf. If you do not feel a stretch, slide your bottom forward to the edge of the chair, while still keeping your heel down. Hold this stretch for 10 seconds. Repeat 3 times. Complete this stretch 2 times per day.   STRETCH  Gastroc, Standing Place hands on wall. Extend right / left leg, keeping the front knee somewhat bent. Slightly point your toes inward on your back foot. Keeping your right / left heel on the floor and your knee straight, shift your weight toward the wall, not allowing your back to arch. You should feel a gentle stretch in the right / left calf. Hold this position  for 10 seconds. Repeat 3 times. Complete this stretch 2 times per day.  STRETCH  Soleus, Standing Place hands on wall. Extend right / left leg, keeping the other knee somewhat bent. Slightly point your toes inward on your back foot. Keep your right / left heel on the floor, bend your back knee, and slightly shift your weight over the back leg so that you feel a gentle stretch deep in your back calf. Hold this position for 10 seconds. Repeat 3 times. Complete this stretch 2 times per day.  STRETCH  Gastrocsoleus, Standing  Note: This exercise can place a lot of stress on your foot and ankle. Please complete this exercise only if specifically instructed by your caregiver.  Place the ball of your right / left foot on a step, keeping your other foot firmly on the same step. Hold on to the wall or a rail for balance. Slowly lift your other foot, allowing your body weight to press your heel down over the edge of the step. You should feel a stretch in your right / left calf. Hold this position for 10 seconds. Repeat this exercise with a slight bend in your right / left knee. Repeat 3 times. Complete this stretch 2 times per day.   STRENGTHENING EXERCISES - Plantar Fasciitis (Heel Spur Syndrome)  These exercises may help you when beginning to rehabilitate your injury. They may resolve your symptoms with or without further involvement from your physician, physical therapist or athletic trainer.   While completing these exercises, remember:  Muscles can gain both the endurance and the strength needed for everyday activities through controlled exercises. Complete these exercises as instructed by your physician, physical therapist or athletic trainer. Progress the resistance and repetitions only as guided.  STRENGTH - Towel Curls Sit in a chair positioned on a non-carpeted surface. Place your foot on a towel, keeping your heel on the floor. Pull the towel toward your heel by only curling your toes.  Keep your heel on the floor. Repeat 3 times. Complete this exercise 2 times per day.  STRENGTH - Ankle Inversion Secure one end of a rubber exercise band/tubing to a fixed object (table, pole). Loop the other end around your foot just before your toes. Place your fists between your knees. This will focus your strengthening at your ankle. Slowly, pull your big toe up and in, making sure the band/tubing is positioned to resist the entire motion. Hold this position for 10 seconds. Have your muscles resist the band/tubing as it slowly pulls your foot back to the starting position. Repeat 3 times. Complete this exercises 2 times per day.  Document Released: 07/02/2005 Document Revised: 09/24/2011 Document Reviewed: 10/14/2008 [employer] Patient Information 2014 [employer].  

## 2022-11-18 NOTE — Progress Notes (Signed)
Subjective: Chief Complaint  Patient presents with   Bunions    bunion correction/ capsulitis -  4 week f/u per dr regal   32 year old female presents the office with above concerns.  For the bunion.  Does not cause him significant pain she has tenderness more the lateral aspect.  She said the injection last appointment was not very helpful.  No recent injuries that she reports.  No swelling.  No numbness or tingling.  Objective: AAO x3, NAD DP/PT pulses palpable bilaterally, CRT less than 3 seconds Moderate severe bunions present with mild tenderness palpation.  No edema, erythema.  Majority tenderness appears to be along the lateral edge of the foot on the distal portion of the peroneal tendon as well as plantarly underneath the fifth metatarsal area.  No specific area of pinpoint tenderness.  Flexor, extensor tendons are intact.  MMT 5/5. No pain with calf compression, swelling, warmth, erythema  Assessment: Tenderness, capsulitis left foot  Plan: -All treatment options discussed with the patient including all alternatives, risks, complications.  -Independently reviewed the x-rays that were performed last appointment.  I was originally concerned about the third metatarsal base but compared to old x-rays I do think this is present previously as well.  She does not have any pain at this specific area. -Prescribed mobic. Discussed side effects of the medication and directed to stop if any are to occur and call the office.  -We discussed the modifications provided for support as well. -Follow-up with repeat injection today. -Discussed bunion surgery.  She is to continue conservative care for now but if symptoms continue or worsen we will reconsider. -If no improvement MRI -Patient encouraged to call the office with any questions, concerns, change in symptoms.   Vivi Barrack DPM

## 2022-12-15 ENCOUNTER — Other Ambulatory Visit: Payer: Self-pay | Admitting: Podiatry

## 2022-12-31 ENCOUNTER — Ambulatory Visit (INDEPENDENT_AMBULATORY_CARE_PROVIDER_SITE_OTHER): Payer: Medicare Other | Admitting: Podiatry

## 2022-12-31 DIAGNOSIS — M722 Plantar fascial fibromatosis: Secondary | ICD-10-CM | POA: Diagnosis not present

## 2022-12-31 DIAGNOSIS — M7672 Peroneal tendinitis, left leg: Secondary | ICD-10-CM | POA: Diagnosis not present

## 2022-12-31 DIAGNOSIS — M778 Other enthesopathies, not elsewhere classified: Secondary | ICD-10-CM

## 2022-12-31 MED ORDER — METHYLPREDNISOLONE 4 MG PO TBPK
ORAL_TABLET | ORAL | 0 refills | Status: DC
Start: 2022-12-31 — End: 2023-01-04

## 2022-12-31 NOTE — Progress Notes (Signed)
Subjective: Chief Complaint  Patient presents with   Bunions    Pt states that the medication is not helping with the pain and the pain is getting worse and worse     32 year old female presents the office with above concerns.  Today most of her pain seems to be in the bottom of the foot on the arch of the foot.  No recent injury or changes otherwise that she reports.  No swelling.     Objective: AAO x3, NAD DP/PT pulses palpable bilaterally, CRT less than 3 seconds Moderate severe bunions present with mild tenderness palpation.  Majority tenderness today seems to be localized in the medial band of plantar fascia in the arch of the foot as well as the plantar aspect of the heel and the insertion into the calcaneus.  There is some discomfort along the distal portion of the peroneal tendon.  There is no area of pinpoint tenderness.  Flexor, extensor tendons appear to be intact.  MMT 5/5. No pain with calf compression, swelling, warmth, erythema  Assessment: Plantar fascia/tendinitis, capsulitis left foot  Plan: -All treatment options discussed with the patient including all alternatives, risks, complications.  -Will switch to Medrol Dosepak which I prescribed today.  Hold off on anti-inflammatories including meloxicam while on the steroids.  We discussed stretching, icing on a regular basis.  Discussed shoes, good arch support.  If symptoms persist we will order an MRI.  Vivi Barrack DPM

## 2022-12-31 NOTE — Patient Instructions (Signed)
Hold any anti-inflammatory medications while on the steroid, including the meloxicam.   ---  Plantar Fasciitis (Heel Spur Syndrome) with Rehab The plantar fascia is a fibrous, ligament-like, soft-tissue structure that spans the bottom of the foot. Plantar fasciitis is a condition that causes pain in the foot due to inflammation of the tissue. SYMPTOMS  Pain and tenderness on the underneath side of the foot. Pain that worsens with standing or walking. CAUSES  Plantar fasciitis is caused by irritation and injury to the plantar fascia on the underneath side of the foot. Common mechanisms of injury include: Direct trauma to bottom of the foot. Damage to a small nerve that runs under the foot where the main fascia attaches to the heel bone. Stress placed on the plantar fascia due to bone spurs. RISK INCREASES WITH:  Activities that place stress on the plantar fascia (running, jumping, pivoting, or cutting). Poor strength and flexibility. Improperly fitted shoes. Tight calf muscles. Flat feet. Failure to warm-up properly before activity. Obesity. PREVENTION Warm up and stretch properly before activity. Allow for adequate recovery between workouts. Maintain physical fitness: Strength, flexibility, and endurance. Cardiovascular fitness. Maintain a health body weight. Avoid stress on the plantar fascia. Wear properly fitted shoes, including arch supports for individuals who have flat feet.  PROGNOSIS  If treated properly, then the symptoms of plantar fasciitis usually resolve without surgery. However, occasionally surgery is necessary.  RELATED COMPLICATIONS  Recurrent symptoms that may result in a chronic condition. Problems of the lower back that are caused by compensating for the injury, such as limping. Pain or weakness of the foot during push-off following surgery. Chronic inflammation, scarring, and partial or complete fascia tear, occurring more often from repeated  injections.  TREATMENT  Treatment initially involves the use of ice and medication to help reduce pain and inflammation. The use of strengthening and stretching exercises may help reduce pain with activity, especially stretches of the Achilles tendon. These exercises may be performed at home or with a therapist. Your caregiver may recommend that you use heel cups of arch supports to help reduce stress on the plantar fascia. Occasionally, corticosteroid injections are given to reduce inflammation. If symptoms persist for greater than 6 months despite non-surgical (conservative), then surgery may be recommended.   MEDICATION  If pain medication is necessary, then nonsteroidal anti-inflammatory medications, such as aspirin and ibuprofen, or other minor pain relievers, such as acetaminophen, are often recommended. Do not take pain medication within 7 days before surgery. Prescription pain relievers may be given if deemed necessary by your caregiver. Use only as directed and only as much as you need. Corticosteroid injections may be given by your caregiver. These injections should be reserved for the most serious cases, because they may only be given a certain number of times.  HEAT AND COLD Cold treatment (icing) relieves pain and reduces inflammation. Cold treatment should be applied for 10 to 15 minutes every 2 to 3 hours for inflammation and pain and immediately after any activity that aggravates your symptoms. Use ice packs or massage the area with a piece of ice (ice massage). Heat treatment may be used prior to performing the stretching and strengthening activities prescribed by your caregiver, physical therapist, or athletic trainer. Use a heat pack or soak the injury in warm water.  SEEK IMMEDIATE MEDICAL CARE IF: Treatment seems to offer no benefit, or the condition worsens. Any medications produce adverse side effects.  EXERCISES- RANGE OF MOTION (ROM) AND STRETCHING EXERCISES - Plantar  Fasciitis (  CARE IF:  Treatment seems to offer no benefit, or the condition worsens.  Any medications produce adverse side effects.   EXERCISES- RANGE OF MOTION (ROM) AND STRETCHING EXERCISES - Plantar Fasciitis (Heel Spur Syndrome) These exercises may help you when beginning to rehabilitate your injury. Your symptoms may resolve with or without further involvement from your physician, physical therapist or athletic trainer. While completing these exercises, remember:   Restoring tissue flexibility helps normal motion to return to the joints. This allows healthier, less painful movement and activity.  An effective stretch should be held for at least 30 seconds.  A stretch should never be painful. You should only feel a gentle lengthening or release in the stretched tissue.  RANGE OF MOTION - Toe Extension, Flexion  Sit with your right / left leg crossed over your opposite knee.  Grasp your toes and gently pull them back toward the top of your foot. You should feel a stretch on the bottom of your toes and/or foot.  Hold this stretch for 10 seconds.  Now, gently pull your toes toward the bottom of your foot. You should feel a stretch on the top of your toes and or foot.  Hold this stretch for 10 seconds. Repeat  times. Complete this stretch 3 times per day.   RANGE OF MOTION - Ankle Dorsiflexion, Active Assisted  Remove shoes and sit on a chair that is preferably not on a carpeted surface.  Place right / left foot under knee. Extend your opposite leg for support.  Keeping your heel down, slide your right / left foot back toward the chair until you feel a stretch at your ankle or calf. If you do not feel a stretch, slide your bottom forward to the edge of the chair, while still keeping your heel down.  Hold this stretch for 10 seconds. Repeat 3 times. Complete this stretch 2 times per day.   STRETCH  Gastroc, Standing  Place hands on wall.  Extend right / left leg, keeping the front knee somewhat bent.  Slightly point your toes inward on your back foot.  Keeping your right / left heel on the floor and your  knee straight, shift your weight toward the wall, not allowing your back to arch.  You should feel a gentle stretch in the right / left calf. Hold this position for 10 seconds. Repeat 3 times. Complete this stretch 2 times per day.  STRETCH  Soleus, Standing  Place hands on wall.  Extend right / left leg, keeping the other knee somewhat bent.  Slightly point your toes inward on your back foot.  Keep your right / left heel on the floor, bend your back knee, and slightly shift your weight over the back leg so that you feel a gentle stretch deep in your back calf.  Hold this position for 10 seconds. Repeat 3 times. Complete this stretch 2 times per day.  STRETCH  Gastrocsoleus, Standing  Note: This exercise can place a lot of stress on your foot and ankle. Please complete this exercise only if specifically instructed by your caregiver.   Place the ball of your right / left foot on a step, keeping your other foot firmly on the same step.  Hold on to the wall or a rail for balance.  Slowly lift your other foot, allowing your body weight to press your heel down over the edge of the step.  You should feel a stretch in your right / left calf.  Hold this   position for 10 seconds.  Repeat this exercise with a slight bend in your right / left knee. Repeat 3 times. Complete this stretch 2 times per day.   STRENGTHENING EXERCISES - Plantar Fasciitis (Heel Spur Syndrome)  These exercises may help you when beginning to rehabilitate your injury. They may resolve your symptoms with or without further involvement from your physician, physical therapist or athletic trainer. While completing these exercises, remember:   Muscles can gain both the endurance and the strength needed for everyday activities through controlled exercises.  Complete these exercises as instructed by your physician, physical therapist or athletic trainer. Progress the resistance and repetitions only as guided.  STRENGTH -  Towel Curls  Sit in a chair positioned on a non-carpeted surface.  Place your foot on a towel, keeping your heel on the floor.  Pull the towel toward your heel by only curling your toes. Keep your heel on the floor. Repeat 3 times. Complete this exercise 2 times per day.  STRENGTH - Ankle Inversion  Secure one end of a rubber exercise band/tubing to a fixed object (table, pole). Loop the other end around your foot just before your toes.  Place your fists between your knees. This will focus your strengthening at your ankle.  Slowly, pull your big toe up and in, making sure the band/tubing is positioned to resist the entire motion.  Hold this position for 10 seconds.  Have your muscles resist the band/tubing as it slowly pulls your foot back to the starting position. Repeat 3 times. Complete this exercises 2 times per day.  Document Released: 07/02/2005 Document Revised: 09/24/2011 Document Reviewed: 10/14/2008 [employer] Patient Information 2014 [employer]. 

## 2023-01-04 ENCOUNTER — Ambulatory Visit (HOSPITAL_COMMUNITY)
Admission: EM | Admit: 2023-01-04 | Discharge: 2023-01-04 | Disposition: A | Discharge status: Home / Self Care | Payer: Medicare Other | Attending: Internal Medicine | Admitting: Internal Medicine

## 2023-01-04 ENCOUNTER — Encounter (HOSPITAL_COMMUNITY): Payer: Self-pay

## 2023-01-04 DIAGNOSIS — M79641 Pain in right hand: Secondary | ICD-10-CM

## 2023-01-04 HISTORY — DX: Essential (primary) hypertension: I10

## 2023-01-04 MED ORDER — MELOXICAM 7.5 MG PO TABS: 7.5000 mg | ORAL_TABLET | Freq: Every day | ORAL | 0 refills | Status: DC

## 2023-01-04 NOTE — Discharge Instructions (Signed)
Keep the brace on for one week You may apply heat to the area 2-3 times a day to help the bruise resolve

## 2023-01-04 NOTE — ED Provider Notes (Signed)
[facility]    CSN: [id number] Arrival date & time: 01/04/23  1610      History   Chief Complaint Chief Complaint  Patient presents with   Hand Pain    HPI Amber Salinas is a 32 y.o. female who presents with her mother due to having R palm swelling x 2 days. Her aunt noticed she had bruising on thenal and below the thumb region and applied a bandage. Pt uses her phone constantly and uses her thumbs to text. She denies injuring herself in any way. The swelling is getting better. Denies paresthesia.      Past Medical History:  Diagnosis Date   Acid reflux    ADD (attention deficit disorder)    Hypertension     Patient Active Problem List   Diagnosis Date Noted   Bunion 12/05/2017    History reviewed. No pertinent surgical history.  OB History   No obstetric history on file.      Home Medications    Prior to Admission medications   Medication Sig Start Date End Date Taking? Authorizing Provider  amLODipine (NORVASC) 2.5 MG tablet Take 2.5 mg by mouth 2 (two) times daily. 12/21/21  Yes [provider]  meloxicam (MOBIC) 7.5 MG tablet Take 1 tablet (7.5 mg total) by mouth daily. 01/04/23  Yes Rodriguez-Southworth, Viviana Simpler    Family History History reviewed. No pertinent family history.  Social History Social History   Tobacco Use   Smoking status: Never   Smokeless tobacco: Never  Vaping Use   Vaping Use: Never used  Substance Use Topics   Alcohol use: No   Drug use: No     Allergies   Patient has no known allergies.   Review of Systems Review of Systems  As noted in HPI Physical Exam Triage Vital Signs ED Triage Vitals  Enc Vitals Group     BP 01/04/23 1728 135/87     Pulse Rate 01/04/23 1728 75     Resp 01/04/23 1728 16     Temp 01/04/23 1728 97.8 F (36.6 C)     Temp Source 01/04/23 1728 Oral     SpO2 01/04/23 1728 96 %     Weight 01/04/23 1728 143 lb (64.9 kg)     Height --      Head Circumference  --      Peak Flow --      Pain Score 01/04/23 1727 5     Pain Loc --      Pain Edu? --      Excl. in GC? --    No data found.  Updated Vital Signs BP 135/87 (BP Location: Right Arm)   Pulse 75   Temp 97.8 F (36.6 C) (Oral)   Resp 16   Wt 143 lb (64.9 kg)   LMP 01/04/2023 (Exact Date)   SpO2 96%   BMI 24.55 kg/m   Visual Acuity Right Eye Distance:   Left Eye Distance:   Bilateral Distance:    Right Eye Near:   Left Eye Near:    Bilateral Near:     Physical Exam Vitals and nursing note reviewed.  Constitutional:      General: She is not in acute distress.    Appearance: She is not toxic-appearing.  HENT:     Right Ear: External ear normal.     Left Ear: External ear normal.  Eyes:     General: No scleral icterus.    Conjunctiva/sclera: Conjunctivae normal.  Pulmonary:  Effort: Pulmonary effort is normal.  Musculoskeletal:        General: Normal range of motion.     Cervical back: Neck supple.  Skin:    General: Skin is warm and dry.     Findings: Bruising present.     Comments: R HAND- has fainting ecchymosis of central palm area and thenar region and soft tissue below thumb is swollen and tender. Thumb ROM is normal and non tender  Neurological:     Mental Status: She is alert and oriented to person, place, and time.     Gait: Gait normal.  Psychiatric:        Mood and Affect: Mood normal.        Behavior: Behavior normal.        Thought Content: Thought content normal.        Judgment: Judgment normal.      UC Treatments / Results  Labs (all labs ordered are listed, but only abnormal results are displayed) Labs Reviewed - No data to display  EKG   Radiology No results found.  Procedures Procedures (including critical care time)  Medications Ordered in UC Medications - No data to display  Initial Impression / Assessment and Plan / UC Course  I have reviewed the triage vital signs and the nursing notes.  R hand contusion  She was  placed on a thumb spica splint and advised to wear it x 7 days and was placed on Mobic as noted.    Final Clinical Impressions(s) / UC Diagnoses   Final diagnoses:  Hand pain, right     Discharge Instructions      Keep the brace on for one week You may apply heat to the area 2-3 times a day to help the bruise resolve     ED Prescriptions     Medication Sig Dispense Auth. Provider   meloxicam (MOBIC) 7.5 MG tablet Take 1 tablet (7.5 mg total) by mouth daily. 14 tablet Rodriguez-Southworth, Nettie Elm, PA-C      PDMP not reviewed this encounter.   Garey Ham, [place] 01/04/23 1820

## 2023-01-04 NOTE — ED Triage Notes (Signed)
Patient here today with c/o right hand swelling on the palm of her hand X 2 days. She is not sure if the swelling was there before. Mom states that she uses the phone a lot.

## 2023-02-11 ENCOUNTER — Ambulatory Visit (INDEPENDENT_AMBULATORY_CARE_PROVIDER_SITE_OTHER): Payer: Medicare Other | Admitting: Podiatry

## 2023-02-11 DIAGNOSIS — M778 Other enthesopathies, not elsewhere classified: Secondary | ICD-10-CM

## 2023-02-11 DIAGNOSIS — M456 Ankylosing spondylitis lumbar region: Secondary | ICD-10-CM

## 2023-02-11 DIAGNOSIS — M7672 Peroneal tendinitis, left leg: Secondary | ICD-10-CM

## 2023-02-11 DIAGNOSIS — M19072 Primary osteoarthritis, left ankle and foot: Secondary | ICD-10-CM

## 2023-02-11 DIAGNOSIS — M722 Plantar fascial fibromatosis: Secondary | ICD-10-CM | POA: Diagnosis not present

## 2023-02-11 NOTE — Patient Instructions (Addendum)
I have ordered a MRI of the left foot and ankle for [facility]. If you do not hear for them about scheduling within the next 1 week, or you have any questions please give [place] a call at [phone].   Get the blood work done at [facility]  --   Bota para [employer], Adult  [place] bota para caminar le inmoviliza el pie o el tobillo despus de una herida o un procedimiento mdico. Esto contribuye a la recuperacin e impide que se agrave la lesin. Tiene un marco exterior duro y rgido, el cual limita el movimiento y proporciona apoyo al pie y a la pierna. La parte interna consiste en una capa de material acolchado. Las botas para caminar tambin tienen tiras ajustables que se abrochan sobre el pie y la pierna. Pueden indicarle el uso de una bota para caminar si puede apoyar peso (soportar peso) Dispensing optician. Lo que pueda caminar al usar la bota depender del tipo y de la gravedad de su lesin. Cmo ponerse una bota para caminar Hay distintos tipos de botas para caminar. Cada tipo tiene instrucciones especficas sobre cmo usarla de [place]. Siga las instrucciones del mdico, por ejemplo: Pdale ayuda a alguien para colocarse la bota, si es necesario. Sintese para colocarse la bota. Hacerlo de este modo es ms cmodo y [place] a Radio producer cadas. Abra la bota por completo. Coloque el pie en la bota de modo que el taln se apoye sobre la parte trasera. Sus dedos deben estar apoyados sobre la base de la bota. No deben colgar por encima del borde delantero. Ajuste las cintas para que la bota calce bien, pero no est demasiado apretada. No doble el marco rgido de la bota para que se ajuste bien. Cmo caminar con una bota para caminar La cantidad de tiempo que pueda caminar depender de su lesin. Algunos consejos para arreglrselas con una bota incluyen los siguientes: No trate de caminar sin usar la bota, a menos que su mdico lo autorice. Use otros dispositivos  de asistencia para caminar, como muletas o bastones, como se lo haya indicado el mdico. En el pie que no est lesionado, use un zapato con un taco similar a la altura de la bota para caminar. Debe tener cuidado al caminar en superficies irregulares o mojadas. Cmo reducir la hinchazn al usar una bota para caminar?  Repose el pie o la pierna lesionada todo el tiempo que pueda. Si se lo indican, aplique hielo sobre la zona de la lesin. Para hacer esto: Ponga el hielo en una bolsa plstica. Coloque una toalla entre la piel y Copy. Aplique el hielo durante 20 minutos, 2 o 3 veces por da. Retire el hielo si la piel se pone de color rojo brillante. Esto es [employer]. Si no puede sentir dolor, calor o fro, tiene un mayor riesgo de que se dae la zona. Mantenga levantados (elevados) el pie o la pierna lesionados por encima del nivel del corazn durante al menos 2 a 3 horas al da o como se lo haya indicado el mdico. Si la hinchazn empeora, afloje la bota. Descanse y [employer] y la pierna. Cmo cuidar la piel y el pie mientras [place] una bota para caminar Use una media larga para protegerse el pie y la pierna del roce dentro de la bota. [place] la bota una vez por da para revisar el rea lesionada. Contrlese el pie, la piel circundante y la pierna para asegurarse de  que no hay ampollas, sarpullido, hinchazn o heridas. La piel debe tener un color saludable, no debe verse plida ni azul. Trate de ver si su patrn de caminata (marcha) con la bota es lo suficientemente normal y si est caminando sin una renguera notable. Siga las instrucciones del mdico acerca del cuidado de la incisin o herida, si corresponde. Limpie y lave el rea de la lesin como se lo haya indicado el mdico. Seque suavemente el pie y la pierna antes de volver a colocarse la bota. Cmo quitarse la bota para caminar Siga las instrucciones de su mdico para quitarse la bota para Advertising account planner. Por lo general, se puede  quitar la bota: Cuando duerma o descanse. Para limpiarse el pie y la pierna. Cmo mantener la bota para caminar limpia No coloque ninguna de las partes de la bota en el lavarropas o en la secadora. No use productos qumicos de limpieza. Esto podra irritarle la piel, especialmente si tiene una herida o incisin. No sumerja el forro de Mudlogger. Use un pao con [place] y agua para limpiar la estructura y el forro de la bota a Emergency planning/management officer. Espere que la bota se seque por completo antes de volvrsela a Scientific laboratory technician. Siga estas instrucciones en su casa: Actividad Su actividad estar restringida segn el tipo y la gravedad de la lesin. Siga las instrucciones del mdico. Adems: Dchese y bese como se lo haya indicado el mdico. No haga ninguna actividad que pudiera empeorar la lesin. No conduzca si el pie afectado es el que [place] para conducir. Comunquese con un mdico si: La bota se agrieta o se daa. La bota no se [place]. Le duele el pie o la pierna. Tiene sarpullido, ampollas o un herida abierta (lcera) en el pie o la pierna. La piel del pie o la pierna est plida. Tiene una herida o incisin en el pie que est empeorando. La piel se enrojece, se irrita o le duele. La hinchazn no mejora o empeora. Solicite ayuda de inmediato si: Tiene entumecimiento en el pie o la pierna. La piel del pie o de la pierna est fra, azul o gris. Resumen Una bota para caminar le inmoviliza el pie o el tobillo despus de una herida o un procedimiento mdico. Hay distintos tipos de botas para caminar. Siga las instrucciones sobre cmo usar correctamente la bota. Pdale ayuda a alguien para colocarse la bota, si es necesario. Es importante que se revise la piel y el pie a diario. Comunquese con su mdico si tiene sarpullido o ampollas en el pie o la pierna. Esta informacin no tiene Theme park manager el consejo del mdico. Asegrese de hacerle al mdico cualquier pregunta que  tenga. Document Revised: 06/07/2020 Document Reviewed: 05/13/2020 Elsevier Patient Education  2024 [employer].

## 2023-02-11 NOTE — Progress Notes (Signed)
Subjective: Chief Complaint  Patient presents with   Plantar Fasciitis    32 year old female presents for above concerns.  She states that she still having pain to her pain since beginning first.  No recent injury or changes.  She does get low back pain at time.   Objective: AAO x3, NAD DP/PT pulses palpable bilaterally, CRT less than 3 seconds Moderate severe bunions present with mild tenderness palpation.  The tenderness is still mostly localized in the medial band of plantar fascia in the arch of the foot as well as the plantar aspect of the heel and the insertion into the calcaneus.  There is some discomfort along the distal portion of the peroneal tendon.  There is no area of pinpoint tenderness.  Flexor, extensor tendons appear to be intact.  MMT 5/5.  No pain with calf compression, swelling, warmth, erythema  Assessment: 32 year old female with chronic foot pain  Plan: -All treatment options discussed with the patient including all alternatives, risks, complications.  -MRI given her ongoing discomfort despite conservative treatment. -Patient presents for immobilization to help facilitate healing in the meantime. -Ordered follow-up with neuropsychiatry, HLA-B27, anti-CCP, sed rate, CRP. -Patient encouraged to call the office with any questions, concerns, change in symptoms.   Vivi Barrack DPM

## 2023-03-14 ENCOUNTER — Ambulatory Visit
Admission: RE | Admit: 2023-03-14 | Discharge: 2023-03-14 | Disposition: A | Discharge status: Home / Self Care | Payer: Medicare Other | Source: Ambulatory Visit | Attending: Podiatry | Admitting: Podiatry

## 2023-03-14 DIAGNOSIS — M722 Plantar fascial fibromatosis: Secondary | ICD-10-CM

## 2023-03-14 DIAGNOSIS — M7672 Peroneal tendinitis, left leg: Secondary | ICD-10-CM

## 2023-03-14 DIAGNOSIS — M778 Other enthesopathies, not elsewhere classified: Secondary | ICD-10-CM

## 2023-03-20 ENCOUNTER — Encounter: Payer: Self-pay | Admitting: Podiatry

## 2023-03-29 ENCOUNTER — Telehealth: Payer: Self-pay | Admitting: Podiatry

## 2023-03-29 NOTE — Telephone Encounter (Signed)
Can we schedule her a follow up to go over the MRI results? Thanks!

## 2023-04-02 NOTE — Telephone Encounter (Signed)
error 

## 2023-04-04 ENCOUNTER — Ambulatory Visit (INDEPENDENT_AMBULATORY_CARE_PROVIDER_SITE_OTHER): Payer: Medicare Other | Admitting: Podiatry

## 2023-04-04 DIAGNOSIS — M21619 Bunion of unspecified foot: Secondary | ICD-10-CM

## 2023-04-04 DIAGNOSIS — M722 Plantar fascial fibromatosis: Secondary | ICD-10-CM

## 2023-04-04 DIAGNOSIS — M7672 Peroneal tendinitis, left leg: Secondary | ICD-10-CM

## 2023-04-04 DIAGNOSIS — M778 Other enthesopathies, not elsewhere classified: Secondary | ICD-10-CM

## 2023-04-04 MED ORDER — CELECOXIB 100 MG PO CAPS: 100.0000 mg | ORAL_CAPSULE | Freq: Two times a day (BID) | ORAL | 0 refills | Status: AC

## 2023-04-04 NOTE — Progress Notes (Signed)
Subjective: Chief Complaint  Patient presents with   Foot Pain    (Rm 11)-  here for MRI results-  has been wearing the boot during the day.  Hasn't noticed any improvement.     32 year old female presents the office for above concerns.  I previously contact her with the MRI results and she presents today for further discussion regards to this.  She says that she gets pain to her foot and she points more globally along her foot where she has discomfort.  No recent injuries or changes otherwise.  Objective: AAO x3, NAD DP/PT pulses palpable bilaterally, CRT less than 3 seconds Moderate severe bunions present with mild tenderness palpation.  The tenderness is still mostly localized in the medial band of plantar fascia in the arch of the foot as well as the plantar aspect of the heel and the insertion into the calcaneus.  There is some discomfort along the distal portion of the peroneal tendon.  There is no area of pinpoint tenderness.  She does get some discomfort in the course the peroneal tendons.  Clinically the tendons may be intact.  Discomfort noted along the MTPJ's.  Unable to appreciate any area of soft tissue mass or fluctuation.  Flexor, extensor tendons appear to be intact.  MMT 5/5.  No pain with calf compression, swelling, warmth, erythema  Assessment: Chronic foot pain  Plan: -All treatment options discussed with the patient including all alternatives, risks, complications.  -Again reviewed the MRI we discussed both conservative as well as surgical treatment options. -Celebrex -Referral to physical therapy -Shoes, good arch support. -Patient encouraged to call the office with any questions, concerns, change in symptoms.    Vivi Barrack DPM

## 2023-05-02 ENCOUNTER — Encounter: Payer: Self-pay | Admitting: Podiatry

## 2023-05-02 ENCOUNTER — Ambulatory Visit (INDEPENDENT_AMBULATORY_CARE_PROVIDER_SITE_OTHER): Payer: Medicare Other | Admitting: Podiatry

## 2023-05-02 DIAGNOSIS — M21619 Bunion of unspecified foot: Secondary | ICD-10-CM | POA: Diagnosis not present

## 2023-05-02 DIAGNOSIS — M722 Plantar fascial fibromatosis: Secondary | ICD-10-CM

## 2023-05-02 DIAGNOSIS — M778 Other enthesopathies, not elsewhere classified: Secondary | ICD-10-CM

## 2023-05-02 DIAGNOSIS — M7672 Peroneal tendinitis, left leg: Secondary | ICD-10-CM | POA: Diagnosis not present

## 2023-05-02 NOTE — Patient Instructions (Signed)
For inserts I like POWERSTEPS, SUPERFEET, AETREX  WEARING INSTRUCTIONS FOR ORTHOTICS  Don't expect to be comfortable wearing your orthotic devices for the first time.  Like eyeglasses, you may be aware of them as time passes, they will not be uncomfortable and you will enjoy wearing them.  FOLLOW THESE INSTRUCTIONS EXACTLY!  Wear your orthotic devices for:       Not more than 1 hour the first day.       Not more than 2 hours the second day.       Not more than 3 hours the third day and so on.        Or wear them for as long as they feel comfortable.       If you experience discomfort in your feet or legs take them out.  When feet & legs feel       better, put them back in.  You do need to be consistent and wear them a little        everyday. 2.   If at any time the orthotic devices become acutely uncomfortable before the       time for that particular day, STOP WEARING THEM. 3.   On the next day, do not increase the wearing time. 4.   Subsequently, increase the wearing time by 15-30 minutes only if comfortable to do       so. 5.   You will be seen by your doctor about 2-4 weeks after you receive your orthotic       devices, at which time you will probably be wearing your devices comfortably        for about 8 hours or more a day. 6.   Some patients occasionally report mild aches or discomfort in other parts of the of       body such as the knees, hips or back after 3 or 4 consecutive hours of wear.  If this       is the case with you, do not extend your wearing time.  Instead, cut it back an hour or       two.  In all likelihood, these symptoms will disappear in a short period of time as your       body posture realigns itself and functions more efficiently. 7.   It is possible that your orthotic device may require some small changes or adjustment       to improve their function or make them more comfortable.   This is usually not done       before one to three months have elapsed.   These adjustments are made in        accordance with the changed position your feet are assuming as a result of       improved biomechanical function. 8.   In women's shoes, it's not unusual for your heel to slip out of the shoe, particularly if       they are step-in-shoes.  If this is the case, try other shoes or other styles.  Try to       purchase shoes which have deeper heal seats or higher heel counters. 9.   Squeaking of orthotics devices in the shoes is due to the movement of the devices       when they are functioning normally.  To eliminate squeaking, simply dust some       baby powder into your shoes before inserting the devices.  If this  does not work,        apply soap or wax to the edges of the orthotic devices or put a tissue into the shoes. 10. It is important that you follow these directions explicitly.  Failure to do so will simply       prolong the adjustment period or create problems which are easily avoided.  It makes       no difference if you are wearing your orthotic devices for only a few hours after        several months, so long as you are wearing them comfortably for those hours. 11. If you have any questions or complaints, contact our office.  We have no way of       knowing about your problems unless you tell us.  If we do not hear from you, we will       assume that you are proceeding well.

## 2023-05-02 NOTE — Progress Notes (Signed)
Subjective: Chief Complaint  Patient presents with   Plantar Fasciitis    PATIENT STATES THAT ER LF HAS BEEN BOTHERING HER EVERYDAY , BEEN TAKING MEDICATION THAT WAS PROSCRIBED BY FOOT DOCTOR .  PATIENT STATES THAT THAT HER FOOT STILL HURTS EVEN WITH THE BOOT ON     32 year old female presents the office for above concerns.  After this appointment referred to physical therapy and she states that she never went.  She still in the cam boot.  No new concerns.  Symptoms are unchanged.  Objective: AAO x3, NAD DP/PT pulses palpable bilaterally, CRT less than 3 seconds Moderate severe bunions present with mild tenderness palpation.  The tenderness is still mostly localized in the medial band of plantar fascia in the arch of the foot as well as the plantar aspect of the heel and the insertion into the calcaneus.  There is some discomfort along the distal portion of the peroneal tendon.  There is no area of pinpoint tenderness.  She does get some discomfort in the course the peroneal tendons.  Clinically the tendons may be intact.  Discomfort noted along the MTPJ's.  Unable to appreciate any area of soft tissue mass or fluctuation.  Flexor, extensor tendons appear to be intact.  MMT 5/5.  Overall symptoms are unchanged. No pain with calf compression, swelling, warmth, erythema  Assessment: Chronic foot pain  Plan: -All treatment options discussed with the patient including all alternatives, risks, complications.  -Again reviewed the MRI we discussed both conservative as well as surgical treatment options.  We discussed surgical invention for her foot pain but also her bunions.  She is unsure if she wants to proceed with surgery. -Celebrex -Referral to physical therapy-referral to physical therapy placed for benchmark. -Discussed transition to regular shoe as tolerated. -Shoes, good arch support. -Patient encouraged to call the office with any questions, concerns, change in symptoms.    Vivi Barrack DPM

## 2023-05-13 ENCOUNTER — Ambulatory Visit: Payer: Medicare Other | Admitting: Podiatry

## 2023-07-01 ENCOUNTER — Ambulatory Visit (INDEPENDENT_AMBULATORY_CARE_PROVIDER_SITE_OTHER): Payer: Medicare Other

## 2023-07-01 ENCOUNTER — Ambulatory Visit: Payer: Medicare Other | Admitting: Podiatry

## 2023-07-01 DIAGNOSIS — M778 Other enthesopathies, not elsewhere classified: Secondary | ICD-10-CM | POA: Diagnosis not present

## 2023-07-01 DIAGNOSIS — M21619 Bunion of unspecified foot: Secondary | ICD-10-CM

## 2023-07-01 DIAGNOSIS — M7672 Peroneal tendinitis, left leg: Secondary | ICD-10-CM

## 2023-07-01 DIAGNOSIS — M21612 Bunion of left foot: Secondary | ICD-10-CM | POA: Diagnosis not present

## 2023-07-01 NOTE — Patient Instructions (Signed)

## 2023-07-01 NOTE — Progress Notes (Unsigned)
Subjective: Chief Complaint  Patient presents with   Foot Pain    RM#11 Left foot pain patient states wearing boot for several weeks foot continues to hurt more everyday no relief injection made foot pain worse.   32 year old female presents the above concerns.  She points to the fifth metatarsal base today where she gets the majority discomfort.  No recent injuries or changes.  Has been wearing the cam boot she has a new boot as hers is worn out.  Objective: AAO x3, NAD DP/PT pulses palpable bilaterally, CRT less than 3 seconds Today the majority tenderness is localized on the fifth metatarsal base.  She is on the distal portion Achilles tendon present on the fifth metatarsal base tenderness with localized edema to this area.  Clinically the tendon appears to be intact.  Unable to appreciate any other areas of pinpoint tenderness.  There is no palpable soft tissue mass or fluid collection need to be drained today. No pain with calf compression, swelling, warmth, erythema  Assessment: Insertional peroneal tendinitis left side  Plan: -All treatment options discussed with the patient including all alternatives, risks, complications.  -X-rays obtained reviewed.  3 views of the foot were obtained.  Severe bunions present.  No evidence of acute fracture. -We again discussed the conservative as well as medical treatment options.  We discussed a steroid injection ultimately she want to proceed with this today.  Clean the skin with alcohol and mixture of 0.5 cc of dexamethasone phosphate, 0.5 cc of Marcaine plain was infiltrated into the fifth metatarsal base making sure to evaluate in the area of the peroneal tendon.  Postinjection care discussed.  Tolerated well.  Continue with cam boot for now a new cam boot was dispensed today as her was no are functioning correctly. -If symptoms persist would recommend surgical intervention as well as biopsy, removal of soft tissue masses.  Consider bunion surgery  as well symptoms were coming pain previously.  -Patient encouraged to call the office with any questions, concerns, change in symptoms.   Return in about 4 weeks (around 07/29/2023).  Vivi Barrack DPM

## 2023-07-29 ENCOUNTER — Encounter: Payer: Self-pay | Admitting: Podiatry

## 2023-07-29 ENCOUNTER — Ambulatory Visit (INDEPENDENT_AMBULATORY_CARE_PROVIDER_SITE_OTHER): Payer: Medicare Other | Admitting: Podiatry

## 2023-07-29 DIAGNOSIS — M21619 Bunion of unspecified foot: Secondary | ICD-10-CM | POA: Diagnosis not present

## 2023-07-29 DIAGNOSIS — M7672 Peroneal tendinitis, left leg: Secondary | ICD-10-CM

## 2023-07-29 DIAGNOSIS — M778 Other enthesopathies, not elsewhere classified: Secondary | ICD-10-CM | POA: Diagnosis not present

## 2023-07-29 MED ORDER — METHYLPREDNISOLONE 4 MG PO TBPK: ORAL_TABLET | ORAL | 0 refills | Status: AC

## 2023-08-04 NOTE — Progress Notes (Signed)
Subjective: Chief Complaint  Patient presents with   Foot Pain    RM#11 Follow up left foot pain hasn't gotten any better wearing boot as directed.    33 year old female presents the above concerns.  States that she is still doing physical therapy and overall seems to have the same.  Has not seen significant improvement.  She is still in the cam boot.  No recent injury or changes otherwise.   Objective: AAO x3, NAD DP/PT pulses palpable bilaterally, CRT less than 3 seconds Today the majority tenderness is localized on the fifth metatarsal base.  There is some discomfort on the distal portion of the peroneal tendon inferior to lateral malleolus towards the fifth metatarsal base.  There is slight edema but no erythema.  No significant of the Achilles tendon today although she does get some discomfort of the area as well.  She does have significant bunion.  Occasionally she will get some swelling, discoloration pointing along the forefoot centrally but no significant pain or soft tissue mass identified today. No pain with calf compression, swelling, warmth, erythema  Assessment: Insertional peroneal tendinitis left side, bunion, foot pain  Plan: -All treatment options discussed with the patient including all alternatives, risks, complications.  -We discussed the conservative as well as surgical treatment options.  She said that she is nervous to proceed with surgery.  She would like for her mom to come discussed the surgery as well as we discussed next appointment to bring her mom or whoever to help further discuss should she want proceed with surgery.  In the meantime continue with conservative treatment.  She can continue the cam boot we discussed gradual transition to regular shoe as tolerated as well as to have good arch support.  She is in continue with physical therapy.  Prescribed Medrol Dosepak today.  Return in about 6 weeks (around 09/09/2023).  Vivi Barrack DPM

## 2023-09-19 ENCOUNTER — Encounter: Payer: Self-pay | Admitting: Podiatry

## 2023-09-19 ENCOUNTER — Ambulatory Visit (INDEPENDENT_AMBULATORY_CARE_PROVIDER_SITE_OTHER): Payer: Medicare Other | Admitting: Podiatry

## 2023-09-19 DIAGNOSIS — M7672 Peroneal tendinitis, left leg: Secondary | ICD-10-CM | POA: Diagnosis not present

## 2023-09-19 DIAGNOSIS — M722 Plantar fascial fibromatosis: Secondary | ICD-10-CM | POA: Diagnosis not present

## 2023-09-19 DIAGNOSIS — M21619 Bunion of unspecified foot: Secondary | ICD-10-CM | POA: Diagnosis not present

## 2023-09-19 MED ORDER — IBUPROFEN 600 MG PO TABS: 600.0000 mg | ORAL_TABLET | Freq: Three times a day (TID) | ORAL | 0 refills | Status: AC | PRN

## 2023-09-19 NOTE — Patient Instructions (Addendum)
 For instructions on how to put on your Plantar Fascial Brace, please visit [email or web address]   --  Ibuprofen Capsules or Tablets What is this medication? IBUPROFEN (eye BYOO proe fen) treats mild to moderate pain, inflammation, or arthritis. It may also be used to reduce fever. It belongs to a group of medications called NSAIDs. This medicine may be used for other purposes; ask your health care provider or pharmacist if you have questions. COMMON BRAND NAME(S): Advil, Advil Junior Strength, Advil Migraine, ALIVIO, Genpril, Ibren, IBU, Ibupak, Midol, Midol Cramps and Body Aches, Motrin, Motrin IB, Motrin Junior Strength, Motrin Migraine Pain, Samson-8, Toxicology Saliva Collection What should I tell my care team before I take this medication? They need to know if you have any of these conditions: Bleeding disorder Coronary artery bypass graft (CABG) within the past 2 weeks Dehydration Diarrhea Frequently drink alcohol Heart attack Heart disease Heart failure High blood pressure Kidney disease Liver disease Lung or breathing disease, such as asthma Receiving steroids such as dexamethasone or prednisone Stomach bleeding Stomach ulcers, other stomach or intestine problems Stroke Take medications that treat or prevent blood clots Tobacco use Vomiting An unusual or allergic reaction to ibuprofen, other medications, foods, dyes, or preservatives Pregnant or trying to get pregnant Breastfeeding How should I use this medication? Take this medication by mouth. Take it as directed on the label. You can take it with or without food. If it upsets your stomach, take it with food. Talk to your care team about the use of this medication in children. While it may be prescribed for children as young as 12 for selected conditions, precautions do apply. People 65 years and older may have a stronger reaction and need a smaller dose. If you get this medication as a prescription, a special  MedGuide will be given to you by the pharmacist with each prescription and refill. Be sure to read this information carefully each time. Overdosage: If you think you have taken too much of this medicine contact a poison control center or emergency room at once. NOTE: This medicine is only for you. Do not share this medicine with others. What if I miss a dose? If you take this medication on a regular basis, take it as soon as you can. If it is almost time for your next dose, take only that dose. Do not take double or extra doses. What may interact with this medication? Do not take this medication with any of the following: Cidofovir Ketorolac Methotrexate Pemetrexed This medication may also interact with the following: Alcohol Aspirin Diuretics Lithium Other medications for inflammation such as prednisone Warfarin This list may not describe all possible interactions. Give your health care provider a list of all the medicines, herbs, non-prescription drugs, or dietary supplements you use. Also tell them if you smoke, drink alcohol, or use illegal drugs. Some items may interact with your medicine. What should I watch for while using this medication? Visit your care team for regular checks on your progress. Tell your care team if your symptoms do not start to get better or if they get worse. A painful sore throat or sore throat with high fevers, headaches, nausea, or vomiting may be signs of a serious infection. Call your care team if these symptoms occur. Do not use this medication for more than 2 days or give to children under 49 years of age unless your care team tells you to. Do not take other medications that contain aspirin, ibuprofen, or naproxen with this medication.  Side effects such as stomach upset, nausea, or ulcers may be more likely to occur. Many non-prescription medications contain aspirin, ibuprofen, or naproxen. Always read labels carefully. This medication can cause serious  ulcers and bleeding in the stomach. It can happen with no warning. Tobacco, alcohol, older age, and poor health can also increase risks. Call your care team right away if you have stomach pain or blood in your vomit or stool. This medication does not prevent a heart attack or stroke. This medication may increase the chance of a heart attack or stroke. The chance may increase the longer you use this medication or if you have heart disease. If you take aspirin to prevent a heart attack or stroke, talk to your care team about using this medication. This medication may cause serious skin reactions. They can happen weeks to months after starting the medication. Contact your care team right away if you notice fevers or flu-like symptoms with a rash. The rash may be red or purple and then turn into blisters or peeling of the skin. Or, you might notice a red rash with swelling of the face, lips or lymph nodes in your neck or under your arms. Talk to your care team if you wish to become pregnant or think you might be pregnant. This medication can cause serious birth defects if taken during pregnancy. This medication may affect your coordination, reaction time, or judgment. Do not drive or operate machinery until you know how this medication affects you. Sit up or stand slowly to reduce the risk of dizzy or fainting spells. Drinking alcohol with this medication can increase the risk of these side effects. Be careful brushing or flossing your teeth or using a toothpick because you may get an infection or bleed more easily. If you have any dental work done, tell your dentist you are receiving this medication. This medication may make it more difficult to get pregnant. Talk to your care team if you are concerned about your fertility. What side effects may I notice from receiving this medication? Side effects that you should report to your care team as soon as possible: Allergic reactions--skin rash, itching, hives,  swelling of the face, lips, tongue, or throat Bleeding--bloody or black, tar-like stools, vomiting blood or brown material that looks like coffee grounds, red or dark brown urine, small red or purple spots on skin, unusual bruising or bleeding Heart attack--pain or tightness in the chest, shoulders, arms, or jaw, nausea, shortness of breath, cold or clammy skin, feeling faint or lightheaded Heart failure--shortness of breath, swelling of the ankles, feet, or hands, sudden weight gain, unusual weakness or fatigue Increase in blood pressure Kidney injury--decrease in the amount of urine, swelling of the ankles, hands, or feet Liver injury--right upper belly pain, loss of appetite, nausea, light-colored stool, dark yellow or brown urine, yellowing skin or eyes, unusual weakness or fatigue Rash, fever, and swollen lymph nodes Redness, blistering, peeling, or loosening of the skin, including inside the mouth Stroke--sudden numbness or weakness of the face, arm, or leg, trouble speaking, confusion, trouble walking, loss of balance or coordination, dizziness, severe headache, change in vision Side effects that usually do not require medical attention (report to your care team if they continue or are bothersome): Headache Loss of appetite Nausea Upset stomach This list may not describe all possible side effects. Call your doctor for medical advice about side effects. You may report side effects to FDA at 1-800-FDA-1088. Where should I keep my medication? Keep out  of the reach of children and pets. Store at room temperature between 20 and 25 degrees C (68 and 77 degrees F). Get rid of any unused medication after the expiration date. To get rid of medications that are no longer needed or have expired: Take the medication to a medication take-back program. Check with your pharmacy or law enforcement to find a location. If you cannot return the medication, check the label or package insert to see if the  medication should be thrown out in the garbage or flushed down the toilet. If you are not sure, ask your care team. If it is safe to put it in the trash, empty the medication out of the container. Mix the medication with cat litter, dirt, coffee grounds, or other unwanted substance. Seal the mixture in a bag or container. Put it in the trash. NOTE: This sheet is a summary. It may not cover all possible information. If you have questions about this medicine, talk to your doctor, pharmacist, or health care provider.  2024 [employer] Standard (2022-04-17 00:00:00)

## 2023-09-19 NOTE — Progress Notes (Signed)
 Subjective: Chief Complaint  Patient presents with   Foot Pain    RM#11 Follow up left foot pain still aches on the bottom and going physical therapy.    33 year old female presents the above concerns.  She said that she is still unsure about doing surgery and she is continue physical therapy.  Today more the discomfort of the bottom of the heel that she reports.  Still gets some discomfort of the ball of the foot.  She does not report any new injuries or other concerns today.  Objective: AAO x3, NAD DP/PT pulses palpable bilaterally, CRT less than 3 seconds Today the majority tenderness is on the plantar aspect calcaneus on insertion of the plantar fascia.  There is no pain with lateral compression of calcaneus.  No pain Achilles tendon.  Slight discomfort on the metatarsal base along the peroneal tendon.  Tenderness submetatarsal area 5 times significant pain today.  There is no area pinpoint tenderness.  Ankle, subtalar range of motion intact. Bunion present.  No pain with calf compression, swelling, warmth, erythema  Assessment: Closed, insertional peroneal tendinitis left side, bunion, foot pain  Plan: -All treatment options discussed with the patient including all alternatives, risks, complications.  -We again discussed the conservative as well as surgical options.  She is still unsure if she wants to proceed with surgery.  Today most of her symptoms are to be healed.  She can continue physical therapy.  Dispensed a plantar fascia brace as well as night splint to help support, stretch and stabilize the plantar fascia.  Continue shoes, good arch support. -Ibuprofen prn  Return in about 6 weeks (around 10/31/2023).  Vivi Barrack DPM
